# Patient Record
Sex: Female | Born: 1995 | Race: Black or African American | Marital: Single
Health system: Northeastern US, Community
[De-identification: ages and names within clinical notes are randomized; demographics above are authoritative.]

## PROBLEM LIST (undated history)

## (undated) HISTORY — PX: MANDIBLE FRACTURE SURGERY: SHX706

---
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
## 2016-07-14 ENCOUNTER — Emergency Department (HOSPITAL_BASED_OUTPATIENT_CLINIC_OR_DEPARTMENT_OTHER)
Admission: RE | Admit: 2016-07-14 | Disposition: A | Discharge status: Home / Self Care | Payer: Self-pay | Source: Emergency Department | Attending: Emergency Medicine | Admitting: Emergency Medicine

## 2016-07-14 ENCOUNTER — Encounter (HOSPITAL_BASED_OUTPATIENT_CLINIC_OR_DEPARTMENT_OTHER): Payer: Self-pay

## 2016-07-14 LAB — VAGINITIS PANEL (DNA PROBE)
CANDIDA SPECIES: NEGATIVE
GARDNERELLA VAGINALIS: POSITIVE
TRICHOMONAS VAGINALIS: NEGATIVE

## 2016-07-14 LAB — CBC, PLATELET & DIFFERENTIAL
ABSOLUTE BASO COUNT: 0 10*3/uL (ref 0.0–0.1)
ABSOLUTE EOSINOPHIL COUNT: 0.1 10*3/uL (ref 0.0–0.8)
ABSOLUTE IMM GRAN COUNT: 0 10*3/uL (ref 0.00–0.03)
ABSOLUTE LYMPH COUNT: 1.2 10*3/uL (ref 0.6–5.9)
ABSOLUTE MONO COUNT: 0.6 10*3/uL (ref 0.2–1.4)
ABSOLUTE NEUTROPHIL COUNT: 1.3 10*3/uL — ABNORMAL LOW (ref 1.6–8.3)
BASOPHIL %: 1.2 % (ref 0.0–1.2)
EOSINOPHIL %: 1.8 % (ref 0.0–7.0)
HEMATOCRIT: 36.4 % (ref 34.1–44.9)
HEMOGLOBIN: 12.1 g/dL (ref 11.2–15.7)
IMMATURE GRANULOCYTE %: 0 % (ref 0.0–0.4)
LYMPHOCYTE %: 37.8 % (ref 15.0–54.0)
MEAN CORP HGB CONC: 33.2 g/dL (ref 31.0–37.0)
MEAN CORPUSCULAR HGB: 29 pg (ref 26.0–34.0)
MEAN CORPUSCULAR VOL: 87.3 fL (ref 80.0–100.0)
MEAN PLATELET VOLUME: 10.7 fL (ref 8.7–12.5)
MONOCYTE %: 18.9 % — ABNORMAL HIGH (ref 4.0–13.0)
NEUTROPHIL %: 40.3 % (ref 40.0–75.0)
PLATELET COUNT: 266 10*3/uL (ref 150–400)
RBC DISTRIBUTION WIDTH STD DEV: 45.4 fL (ref 35.1–46.3)
RBC DISTRIBUTION WIDTH: 14.4 % — ABNORMAL HIGH (ref 11.5–14.3)
RED BLOOD CELL COUNT: 4.17 M/uL (ref 3.90–5.20)
WHITE BLOOD CELL COUNT: 3.3 10*3/uL — ABNORMAL LOW (ref 4.0–11.0)

## 2016-07-14 LAB — POC URINALYSIS
BILIRUBIN, URINE: NEGATIVE
GLUCOSE,URINE: NEGATIVE
KETONE, URINE: NEGATIVE
LEUKOCYTE ESTERASE: NEGATIVE
NITRITE, URINE: NEGATIVE
PH URINE: 8.5 (ref 5.0–8.0)
PROTEIN, URINE: NEGATIVE
SPECIFIC GRAVITY, URINE: 1.015 (ref 1.003–1.030)
UROBILINOGEN URINE: 0.2 (ref 0.2–1.0)

## 2016-07-14 LAB — HOLD BLOOD BANK SPECIMEN

## 2016-07-14 LAB — URINE PREGNANCY TEST (POINT OF CARE): HCG QUALITATIVE URINE: NEGATIVE

## 2016-07-14 LAB — HOLD RED TOP TUBE

## 2016-07-14 NOTE — ED Triage Note (Signed)
21 year old female here with vaginal bleeding for 5 days, she states she is bleeding a lot, its her regular periods , but one pad per hour for the past 5 days, she went to an urgent care for her numbness in her feet and upper extremities , they prefer her evaluated at ER

## 2016-07-14 NOTE — Narrator Note (Signed)
Patient Disposition    Patient education for diagnosis, medications, activity, diet and follow-up.  Patient left ED 2:40 PM.  Patient rep received written instructions.  Interpreter to provide instructions: Yes    Patient belongings with patient: YES    Have all existing LDAs been addressed? Yes    Have all IV infusions been stopped? Yes    Discharged to: Discharged to home

## 2016-07-14 NOTE — Discharge Instructions (Signed)
Evaluated today for heavy vaginal bleeding.     Your blood counts were normal.     Read all provided instructions. Follow up with your PCP within 1 week. You can use the provided number to follow up with OB/GYN. Seek medical attention if you develop new or worsening symptoms or if you have any other concerns.

## 2016-07-14 NOTE — ED Provider Notes (Signed)
The patient was seen primarily by me. ED nursing record was reviewed. Select prior records as available electronically through the Epic record were reviewed.        HPI:    Kristine Simpson is a 21 year old female patient with h/o depression, ADHD who is p/w heavy vaginal bleeding. Pt states she is currently having her menstrual period. Normally lasts 7 days. First 1-2 days are spotting followed but 2-3 days of normal bleeding and 1-2 days of spotting. States that she is going through 1 pad every 3-4 hours. Has been having progressively heavier menstrual periods. Has a nexplanon in place. Feels fatigued. Also states she has intermittent tingling in her hands and feet. No fevers. No rash. No numbness. No weakness of the extremities.         ROS: Pertinent positives were reviewed as per the HPI above. All other systems were reviewed and are negative.  Kristine Simpson  Language of care: English  MRN: [id number]  PCP: No primary care provider on file.  Mode of arrival to ED: Self.  Arrival time: 07/14/2016 12:16 PM  Chief complaint: Vaginal Bleeding (FEEL FAINT;LOSS OF BLOOD WITH)    Past Medical History/Problem list:  History reviewed. No pertinent past medical history.  There is no problem list on file for this patient.    Past Surgical History: History reviewed. No pertinent surgical history.  Social History:     Social History  Social History   Marital status: Single  Spouse name: N/A    Years of education: N/A  Number of children: N/A     Occupational History  None on file     Social History Main Topics   Smoking status: Not on file    Smokeless tobacco: Not on file    Alcohol use Not on file    Drug use: Unknown     Other Topics Concern   None on file     Social History Narrative   None on file      Allergies: Review of Patient's Allergies indicates:  No Known Allergies  Immunizations:   There is no immunization history on file for this patient.       Medications:  None     Physical Exam:   Patient Vitals for the past  99 hrs:   BP Pulse Resp SpO2   07/14/16 1236 104/68 92 16 100 %       GENERAL:  WDWN, no acute distress, non-toxic   SKIN:  Warm & Dry, no rash, no petechia.  HEAD:  NCAT. Sclerae are anicteric and aninjected, oropharynx is clear with moist mucous membranes. PERRL. EOMI.  NECK:  Supple  LUNGS:  Clear to auscultation bilaterally. No wheezes, rales, rhonchi.   HEART:  RRR.  No murmurs, rubs, or gallops.   ABDOMEN:  Soft, NTND.  No masses.  No involuntary guarding or rebound.   EXTREMITIES:  No obvious deformities.  Warm and well perfused.  No cyanosis, no edema.   GENITOURINARY:  No CVA tenderness B.  Pelvic exam performed with chaperone. Moderate amount of blood within the vaginal vault. No brisk bleeding from the cervix. No CMT or adnexal tenderness.   NEUROLOGIC:  Alert; moves all extremities; speaking in sentences. Normal gait without ataxia. CNsII-XII symmetrical and intact. Sensation intact to light touch throughout. 5/5 strength globally.  PSYCHIATRIC:  Appropriate for age, time of day, and situation    Medications Given in the ED:  Medications - No data to display Radiology Results:  Lab Results (abnormal results only):  Labs Reviewed   CBC + PLT + AUTO DIFF - Abnormal; Notable for the following:        Result Value    WHITE BLOOD CELL COUNT 3.3 (*)     RBC DISTRIBUTION WIDTH 14.4 (*)     MONOCYTE % 18.9 (*)     ABSOLUTE NEUTROPHIL COUNT 1.3 (*)     All other components within normal limits   POC URINALYSIS - Abnormal; Notable for the following:     OCCULT BLOOD, URINE TRACE-LYSED (*)     All other components within normal limits   VAGINITIS PANEL (DNA PROBE) - Abnormal; Notable for the following:     GARDNERELLA VAGINALIS GARDNERELLA VAGINALIS (*)     All other components within normal limits    Narrative:     Source Details->Vag   HOLD RED TOP TUBE   HOLD BLOOD BANK SPECIMEN   AMPLIFIED GENPROBE CHLAM/GC    Narrative:     Source Details->Vaginal   URINE PREGNANCY TEST (POINT OF CARE)    Other Results/Old  Record review (e.g. ECG):       ED Course and Medical Decision-making:  21 year old female patient who is presenting with heavy vaginal bleeding and complaints of fatigue. On exam pt is afebrile, hemodynamically stable.     Pelvic exam without signs of significant vaginal bleeding. H/H stable.     No need for imaging at this time given benign abdominal and pelvic exam.     Discussed OB/GYN f/u. Return precautions discussed.     Patient/family educated on diagnosis(es); she states understanding and agrees with plan of care.  Reasons to return to the ED were reviewed in detail. She agrees with this plan and disposition.    Condition on Discharge: Improved and Stable    Diagnosis/Diagnoses:  Menorrhagia with regular cycle    Ebony Rickel G. Rosalene BillingsSafain, MD  This Emergency Department patient encounter note was created using voice-recognition software and in real time during the ED visit.

## 2016-07-15 LAB — CHLAMYDIA GC NAAT
GENPROBE CHLAMYDIA: NEGATIVE
GENPROBE GC: NEGATIVE

## 2016-07-16 LAB — EKG

## 2016-10-14 ENCOUNTER — Other Ambulatory Visit (HOSPITAL_BASED_OUTPATIENT_CLINIC_OR_DEPARTMENT_OTHER): Payer: Self-pay | Admitting: Psychiatry

## 2016-10-14 NOTE — Progress Notes (Unsigned)
The refill request is not appropriate 10/14/2016

## 2016-11-25 ENCOUNTER — Other Ambulatory Visit (HOSPITAL_BASED_OUTPATIENT_CLINIC_OR_DEPARTMENT_OTHER): Payer: Self-pay | Admitting: Psychiatry

## 2017-02-17 ENCOUNTER — Other Ambulatory Visit (HOSPITAL_BASED_OUTPATIENT_CLINIC_OR_DEPARTMENT_OTHER): Payer: Self-pay | Admitting: Psychiatry

## 2017-05-26 ENCOUNTER — Other Ambulatory Visit (HOSPITAL_BASED_OUTPATIENT_CLINIC_OR_DEPARTMENT_OTHER): Payer: Self-pay | Admitting: Psychiatry

## 2017-05-26 NOTE — Progress Notes (Signed)
Refill refused: Patient should contact provider first. (not a [facility] patient )        Sig: TAKE 1 TABLET BY MOUTH EVERY DAY

## 2018-07-29 ENCOUNTER — Encounter: Payer: Self-pay | Admitting: Nurse Practitioner

## 2018-07-29 ENCOUNTER — Ambulatory Visit: Payer: 59 | Admitting: Nurse Practitioner

## 2018-07-29 ENCOUNTER — Other Ambulatory Visit (HOSPITAL_COMMUNITY)
Admission: RE | Admit: 2018-07-29 | Discharge: 2018-07-29 | Disposition: A | Discharge status: Home / Self Care | Payer: 59 | Source: Ambulatory Visit | Attending: Nurse Practitioner | Admitting: Nurse Practitioner

## 2018-07-29 VITALS — BP 98/70 | HR 102 | Temp 98.6°F | Ht 63.0 in | Wt 128.6 lb

## 2018-07-29 DIAGNOSIS — Z124 Encounter for screening for malignant neoplasm of cervix: Secondary | ICD-10-CM | POA: Insufficient documentation

## 2018-07-29 DIAGNOSIS — Z0000 Encounter for general adult medical examination without abnormal findings: Secondary | ICD-10-CM

## 2018-07-29 LAB — COMPREHENSIVE METABOLIC PANEL
ALK PHOS: 34 U/L — AB (ref 39–117)
ALT: 13 U/L (ref 0–35)
AST: 13 U/L (ref 0–37)
Albumin: 4.5 g/dL (ref 3.5–5.2)
BILIRUBIN TOTAL: 1 mg/dL (ref 0.2–1.2)
BUN: 11 mg/dL (ref 6–23)
CO2: 26 mEq/L (ref 19–32)
CREATININE: 0.84 mg/dL (ref 0.40–1.20)
Calcium: 9 mg/dL (ref 8.4–10.5)
Chloride: 105 mEq/L (ref 96–112)
GFR: 84.83 mL/min (ref >60.00)
GLUCOSE: 86 mg/dL (ref 70–99)
Potassium: 3.9 mEq/L (ref 3.5–5.1)
Sodium: 138 mEq/L (ref 135–145)
TOTAL PROTEIN: 6.7 g/dL (ref 6.0–8.3)

## 2018-07-29 LAB — CBC
HCT: 41.7 % (ref 36.0–46.0)
HEMOGLOBIN: 14 g/dL (ref 12.0–15.0)
MCHC: 33.4 g/dL (ref 30.0–36.0)
MCV: 88.9 fl (ref 78.0–100.0)
PLATELETS: 278 10*3/uL (ref 150.0–400.0)
RBC: 4.7 Mil/uL (ref 3.87–5.11)
RDW: 12.5 % (ref 11.5–15.5)
WBC: 7.7 10*3/uL (ref 4.0–10.5)

## 2018-07-29 LAB — TSH: TSH: 1.67 u[IU]/mL (ref 0.35–4.50)

## 2018-07-29 NOTE — Progress Notes (Signed)
Subjective:    Patient ID: Jennifer Haley, female    DOB: 1996-06-02, 23 y.o.   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 Work: Educational psychologist  Sexual History (orientation,birth control, marital status, STD):married, sexually active, use of condoms, 1 female partner. No STD in past. LMP 07/03/2018, regular.  Depression/Suicide: Depression screen [facility] 2/9 07/29/2018  Decreased Interest 0  Down, Depressed, Hopeless 0  PHQ - 2 Score 0   Vision:done annually, use or contact lens  Dental:done every 81months  Immunizations: (TDAP, Hep C screen, Pneumovax, Influenza, zoster)  Health Maintenance  Topic Date Due  . Tetanus Vaccine  08/21/2015  . PAP-Cervical Cytology Screening  08/20/2017  . Pap Smear  08/20/2017  . HIV Screening  07/30/2019*  . Flu Shot  Completed  *Topic was postponed. The date shown is not the original due date.   Diet:regular.  Weight:  Wt Readings from Last 3 Encounters:  07/29/18 128 lb 9.6 oz (58.3 kg)   Exercise:2-3times a week, weight training and cardio  Fall Risk: Fall Risk  07/29/2018  Falls in the past year? 0   Home Safety: home with husband  Medications and allergies reviewed with patient and updated if appropriate.  There are no active problems to display for this patient.   Current Outpatient Medications on File Prior to Visit  Medication Sig Dispense Refill  . cephALEXin (KEFLEX) 500 MG capsule Take 1 capsule (500 mg total) by mouth 2 (two) times daily. (Patient not taking: Reported on 07/29/2018) 14 capsule 0  . dicyclomine (BENTYL) 20 MG tablet Take 1 tablet (20 mg total) by mouth once. (Patient not taking: Reported on 07/29/2018) 20 tablet 0  . ibuprofen (ADVIL,MOTRIN) 200 MG tablet Take 400 mg by mouth every 6 (six) hours as needed for headache, mild pain or moderate pain.    Marland Kitchen loperamide (IMODIUM) 2 MG capsule Take 1 capsule (2 mg total) by mouth 4  (four) times daily as needed for diarrhea or loose stools. (Patient not taking: Reported on 07/29/2018) 12 capsule 0  . ondansetron (ZOFRAN) 4 MG tablet Take 1 tablet (4 mg total) by mouth every 8 (eight) hours as needed for nausea or vomiting. (Patient not taking: Reported on 07/29/2018) 10 tablet 0   No current facility-administered medications on file prior to visit.     History reviewed. No pertinent past medical history.  Past Surgical History:  Procedure Laterality Date  . MANDIBLE FRACTURE SURGERY      Social History   Socioeconomic History  . Marital status: Married    Spouse name: Not on file  . Number of children: 0  . Years of education: Not on file  . Highest education level: Not on file  Occupational History    Comment: student: Engineer, drilling    Comment: Works at [employer]  Social Needs  . Financial resource strain: Not hard at all  . Food insecurity:    Worry: Patient refused    Inability: Patient refused  . Transportation needs:    Medical: Patient refused    Non-medical: Patient refused  Tobacco Use  . Smoking status: Never Smoker  . Smokeless tobacco: Never Used  Substance and Sexual Activity  . Alcohol use: Yes    Comment: social  . Drug use: No  . Sexual activity: Yes    Birth control/protection: Condom  Lifestyle  . Physical activity:    Days per week:  Not on file    Minutes per session: Not on file  . Stress: Not on file  Relationships  . Social connections:    Talks on phone: More than three times a week    Gets together: More than three times a week    Attends religious service: Never    Active member of club or organization: No    Attends meetings of clubs or organizations: Never    Relationship status: Married  Other Topics Concern  . Not on file  Social History Narrative  . Not on file    Family History  Problem Relation Age of Onset  . Asthma Brother   . Heart disease Paternal Grandfather         Review  of Systems  Constitutional: Negative for fever, malaise/fatigue and weight loss.  HENT: Negative for congestion and sore throat.   Eyes:       Negative for visual changes  Respiratory: Negative for cough and shortness of breath.   Cardiovascular: Negative for chest pain, palpitations and leg swelling.  Gastrointestinal: Negative for blood in stool, constipation, diarrhea and heartburn.  Genitourinary: Negative for dysuria, frequency and urgency.  Musculoskeletal: Negative for falls, joint pain and myalgias.  Skin: Negative for rash.  Neurological: Negative for dizziness, sensory change and headaches.  Endo/Heme/Allergies: Does not bruise/bleed easily.  Psychiatric/Behavioral: Negative for depression, substance abuse and suicidal ideas. The patient is not nervous/anxious.     Objective:   Vitals:   07/29/18 1309  BP: 98/70  Pulse: (!) 102  Temp: 98.6 F (37 C)  SpO2: 98%    Body mass index is 22.78 kg/m.   Physical Examination:  Physical Exam Vitals signs reviewed. Exam conducted with a chaperone present.  Constitutional:      General: She is not in acute distress.    Appearance: She is well-developed.  HENT:     Right Ear: Tympanic membrane, ear canal and external ear normal.     Left Ear: Tympanic membrane, ear canal and external ear normal.     Nose: Nose normal.     Mouth/Throat:     Pharynx: No oropharyngeal exudate.  Eyes:     Conjunctiva/sclera: Conjunctivae normal.     Pupils: Pupils are equal, round, and reactive to light.  Neck:     Musculoskeletal: Normal range of motion and neck supple.     Thyroid: No thyroid mass, thyromegaly or thyroid tenderness.  Cardiovascular:     Rate and Rhythm: Normal rate and regular rhythm.     Heart sounds: Normal heart sounds.  Pulmonary:     Effort: Pulmonary effort is normal. No respiratory distress.     Breath sounds: Normal breath sounds.  Chest:     Chest wall: No tenderness.     Breasts:        Right: Normal.          Left: Normal.  Abdominal:     General: Abdomen is flat. Bowel sounds are normal.     Palpations: Abdomen is soft.  Genitourinary:    Vagina: Vaginal discharge present. No erythema or bleeding.     Cervix: No cervical motion tenderness, discharge or friability.     Uterus: Normal.      Adnexa: Right adnexa normal and left adnexa normal.  Musculoskeletal: Normal range of motion.  Lymphadenopathy:     Cervical: No cervical adenopathy.     Upper Body:     Right upper body: No supraclavicular, axillary or pectoral adenopathy.  Left upper body: No supraclavicular, axillary or pectoral adenopathy.  Skin:    Findings: No erythema or rash.  Neurological:     Mental Status: She is alert and oriented to person, place, and time.     Deep Tendon Reflexes: Reflexes are normal and symmetric.  Psychiatric:        Mood and Affect: Mood normal.        Behavior: Behavior normal.        Thought Content: Thought content normal.     ASSESSMENT and PLAN:  Mardella LaymanLindsey was seen today for establish care.  Diagnoses and all orders for this visit:  Preventative health care -     CBC -     Comprehensive metabolic panel -     TSH -     Cytology - PAP( [place])  Encounter for Papanicolaou smear for cervical cancer screening -     Cytology - PAP( [place])   No problem-specific Assessment & Plan notes found for this encounter.     Problem List Items Addressed This Visit    None    Visit Diagnoses    Preventative health care    -  Primary   Relevant Orders   CBC   Comprehensive metabolic panel   TSH   Cytology - PAP( [place])   Encounter for Papanicolaou smear for cervical cancer screening       Relevant Orders   Cytology - PAP( [place])       Follow up: Return if symptoms worsen or fail to improve.  Alysia Pennaharlotte Delcenia Inman, NP

## 2018-07-29 NOTE — Patient Instructions (Addendum)
Ok to use florastor or curturelle probiotics 1cap once a day.  Go to lab for blood draw.  You will be contacted with results.   Health Maintenance, Female Adopting a healthy lifestyle and getting preventive care can go a long way to promote health and wellness. Talk with your health care provider about what schedule of regular examinations is right for you. This is a good chance for you to check in with your provider about disease prevention and staying healthy. In between checkups, there are plenty of things you can do on your own. Experts have done a lot of research about which lifestyle changes and preventive measures are most likely to keep you healthy. Ask your health care provider for more information. Weight and diet Eat a healthy diet  Be sure to include plenty of vegetables, fruits, low-fat dairy products, and lean protein.  Do not eat a lot of foods high in solid fats, added sugars, or salt.  Get regular exercise. This is one of the most important things you can do for your health. ? Most adults should exercise for at least 150 minutes each week. The exercise should increase your heart rate and make you sweat (moderate-intensity exercise). ? Most adults should also do strengthening exercises at least twice a week. This is in addition to the moderate-intensity exercise. Maintain a healthy weight  Body mass index (BMI) is a measurement that can be used to identify possible weight problems. It estimates body fat based on height and weight. Your health care provider can help determine your BMI and help you achieve or maintain a healthy weight.  For females 46 years of age and older: ? A BMI below 18.5 is considered underweight. ? A BMI of 18.5 to 24.9 is normal. ? A BMI of 25 to 29.9 is considered overweight. ? A BMI of 30 and above is considered obese. Watch levels of cholesterol and blood lipids  You should start having your blood tested for lipids and cholesterol at 22 years of  age, then have this test every 5 years.  You may need to have your cholesterol levels checked more often if: ? Your lipid or cholesterol levels are high. ? You are older than 22 years of age. ? You are at high risk for heart disease. Cancer screening Lung Cancer  Lung cancer screening is recommended for adults 3-72 years old who are at high risk for lung cancer because of a history of smoking.  A yearly low-dose CT scan of the lungs is recommended for people who: ? Currently smoke. ? Have quit within the past 15 years. ? Have at least a 30-pack-year history of smoking. A pack year is smoking an average of one pack of cigarettes a day for 1 year.  Yearly screening should continue until it has been 15 years since you quit.  Yearly screening should stop if you develop a health problem that would prevent you from having lung cancer treatment. Breast Cancer  Practice breast self-awareness. This means understanding how your breasts normally appear and feel.  It also means doing regular breast self-exams. Let your health care provider know about any changes, no matter how small.  If you are in your 20s or 30s, you should have a clinical breast exam (CBE) by a health care provider every 1-3 years as part of a regular health exam.  If you are 58 or older, have a CBE every year. Also consider having a breast X-ray (mammogram) every year.  If you  have a family history of breast cancer, talk to your health care provider about genetic screening.  If you are at high risk for breast cancer, talk to your health care provider about having an MRI and a mammogram every year.  Breast cancer gene (BRCA) assessment is recommended for women who have family members with BRCA-related cancers. BRCA-related cancers include: ? Breast. ? Ovarian. ? Tubal. ? Peritoneal cancers.  Results of the assessment will determine the need for genetic counseling and BRCA1 and BRCA2 testing. Cervical Cancer Your  health care provider may recommend that you be screened regularly for cancer of the pelvic organs (ovaries, uterus, and vagina). This screening involves a pelvic examination, including checking for microscopic changes to the surface of your cervix (Pap test). You may be encouraged to have this screening done every 3 years, beginning at age 39.  For women ages 59-65, health care providers may recommend pelvic exams and Pap testing every 3 years, or they may recommend the Pap and pelvic exam, combined with testing for human papilloma virus (HPV), every 5 years. Some types of HPV increase your risk of cervical cancer. Testing for HPV may also be done on women of any age with unclear Pap test results.  Other health care providers may not recommend any screening for nonpregnant women who are considered low risk for pelvic cancer and who do not have symptoms. Ask your health care provider if a screening pelvic exam is right for you.  If you have had past treatment for cervical cancer or a condition that could lead to cancer, you need Pap tests and screening for cancer for at least 20 years after your treatment. If Pap tests have been discontinued, your risk factors (such as having a new sexual partner) need to be reassessed to determine if screening should resume. Some women have medical problems that increase the chance of getting cervical cancer. In these cases, your health care provider may recommend more frequent screening and Pap tests. Colorectal Cancer  This type of cancer can be detected and often prevented.  Routine colorectal cancer screening usually begins at 22 years of age and continues through 22 years of age.  Your health care provider may recommend screening at an earlier age if you have risk factors for colon cancer.  Your health care provider may also recommend using home test kits to check for hidden blood in the stool.  A small camera at the end of a tube can be used to examine your  colon directly (sigmoidoscopy or colonoscopy). This is done to check for the earliest forms of colorectal cancer.  Routine screening usually begins at age 25.  Direct examination of the colon should be repeated every 5-10 years through 22 years of age. However, you may need to be screened more often if early forms of precancerous polyps or small growths are found. Skin Cancer  Check your skin from head to toe regularly.  Tell your health care provider about any new moles or changes in moles, especially if there is a change in a mole's shape or color.  Also tell your health care provider if you have a mole that is larger than the size of a pencil eraser.  Always use sunscreen. Apply sunscreen liberally and repeatedly throughout the day.  Protect yourself by wearing long sleeves, pants, a wide-brimmed hat, and sunglasses whenever you are outside. Heart disease, diabetes, and high blood pressure  High blood pressure causes heart disease and increases the risk of stroke. High  blood pressure is more likely to develop in: ? People who have blood pressure in the high end of the normal range (130-139/85-89 mm Hg). ? People who are overweight or obese. ? People who are African American.  If you are 45-18 years of age, have your blood pressure checked every 3-5 years. If you are 69 years of age or older, have your blood pressure checked every year. You should have your blood pressure measured twice-once when you are at a hospital or clinic, and once when you are not at a hospital or clinic. Record the average of the two measurements. To check your blood pressure when you are not at a hospital or clinic, you can use: ? An automated blood pressure machine at a pharmacy. ? A home blood pressure monitor.  If you are between 32 years and 44 years old, ask your health care provider if you should take aspirin to prevent strokes.  Have regular diabetes screenings. This involves taking a blood sample to  check your fasting blood sugar level. ? If you are at a normal weight and have a low risk for diabetes, have this test once every three years after 21 years of age. ? If you are overweight and have a high risk for diabetes, consider being tested at a younger age or more often. Preventing infection Hepatitis B  If you have a higher risk for hepatitis B, you should be screened for this virus. You are considered at high risk for hepatitis B if: ? You were born in a country where hepatitis B is common. Ask your health care provider which countries are considered high risk. ? Your parents were born in a high-risk country, and you have not been immunized against hepatitis B (hepatitis B vaccine). ? You have HIV or AIDS. ? You use needles to inject street drugs. ? You live with someone who has hepatitis B. ? You have had sex with someone who has hepatitis B. ? You get hemodialysis treatment. ? You take certain medicines for conditions, including cancer, organ transplantation, and autoimmune conditions. Hepatitis C  Blood testing is recommended for: ? Everyone born from [birth year] through 1964. ? Anyone with known risk factors for hepatitis C. Sexually transmitted infections (STIs)  You should be screened for sexually transmitted infections (STIs) including gonorrhea and chlamydia if: ? You are sexually active and are younger than 22 years of age. ? You are older than 22 years of age and your health care provider tells you that you are at risk for this type of infection. ? Your sexual activity has changed since you were last screened and you are at an increased risk for chlamydia or gonorrhea. Ask your health care provider if you are at risk.  If you do not have HIV, but are at risk, it may be recommended that you take a prescription medicine daily to prevent HIV infection. This is called pre-exposure prophylaxis (PrEP). You are considered at risk if: ? You are sexually active and do not regularly use  condoms or know the HIV status of your partner(s). ? You take drugs by injection. ? You are sexually active with a partner who has HIV. Talk with your health care provider about whether you are at high risk of being infected with HIV. If you choose to begin PrEP, you should first be tested for HIV. You should then be tested every 3 months for as long as you are taking PrEP. Pregnancy  If you are premenopausal and you  may become pregnant, ask your health care provider about preconception counseling.  If you may become pregnant, take 400 to 800 micrograms (mcg) of folic acid every day.  If you want to prevent pregnancy, talk to your health care provider about birth control (contraception). Osteoporosis and menopause  Osteoporosis is a disease in which the bones lose minerals and strength with aging. This can result in serious bone fractures. Your risk for osteoporosis can be identified using a bone density scan.  If you are 55 years of age or older, or if you are at risk for osteoporosis and fractures, ask your health care provider if you should be screened.  Ask your health care provider whether you should take a calcium or vitamin D supplement to lower your risk for osteoporosis.  Menopause may have certain physical symptoms and risks.  Hormone replacement therapy may reduce some of these symptoms and risks. Talk to your health care provider about whether hormone replacement therapy is right for you. Follow these instructions at home:  Schedule regular health, dental, and eye exams.  Stay current with your immunizations.  Do not use any tobacco products including cigarettes, chewing tobacco, or electronic cigarettes.  If you are pregnant, do not drink alcohol.  If you are breastfeeding, limit how much and how often you drink alcohol.  Limit alcohol intake to no more than 1 drink per day for nonpregnant women. One drink equals 12 ounces of beer, 5 ounces of wine, or 1 ounces of  hard liquor.  Do not use street drugs.  Do not share needles.  Ask your health care provider for help if you need support or information about quitting drugs.  Tell your health care provider if you often feel depressed.  Tell your health care provider if you have ever been abused or do not feel safe at home. This information is not intended to replace advice given to you by your health care provider. Make sure you discuss any questions you have with your health care provider. Document Released: 12/17/2010 Document Revised: 11/09/2015 Document Reviewed: 03/07/2015 Elsevier Interactive Patient Education  2019 [employer].

## 2018-07-30 LAB — CYTOLOGY - PAP
BACTERIAL VAGINITIS: NEGATIVE
Candida vaginitis: NEGATIVE
Diagnosis: NEGATIVE

## 2018-07-31 ENCOUNTER — Encounter: Payer: Self-pay | Admitting: Nurse Practitioner

## 2018-07-31 ENCOUNTER — Telehealth: Payer: Self-pay | Admitting: Nurse Practitioner

## 2018-07-31 NOTE — Telephone Encounter (Signed)
Copied from CRM #[id number]. Topic: Quick Communication - See Telephone Encounter °>> Jul 31, 2018  3:27 PM Williams, Candice N wrote: °CRM for notification. See Telephone encounter for: 07/31/18. ° °Patient calling to check status of lab results from 2/12. Please advise. °

## 2018-07-31 NOTE — Telephone Encounter (Signed)
Pt is returning call for results 

## 2018-07-31 NOTE — Telephone Encounter (Unsigned)
Copied from CRM [id number]. Topic: Quick Communication - See Telephone Encounter >> Jul 31, 2018  3:27 PM Angela Nevin wrote: CRM for notification. See Telephone encounter for: 07/31/18.  Patient calling to check status of lab results from 2/12. Please advise.

## 2018-07-31 NOTE — Telephone Encounter (Signed)
Pt calling for advise regarding yeast like "Itching."  Message to C. Nche routed in lab results. Instructed pt to speak with pharmacist for recommendations.

## 2018-07-31 NOTE — Telephone Encounter (Signed)
Just left pt VM to return call about lab results. 07/29/2018

## 2018-07-31 NOTE — Telephone Encounter (Signed)
Addressed in result note.  

## 2018-08-03 NOTE — Telephone Encounter (Signed)
This encounter was created in error - please disregard.

## 2018-09-21 DIAGNOSIS — H0015 Chalazion left lower eyelid: Secondary | ICD-10-CM | POA: Diagnosis not present

## 2018-10-05 ENCOUNTER — Telehealth: Payer: Self-pay | Admitting: Nurse Practitioner

## 2018-10-05 NOTE — Telephone Encounter (Signed)
I called and left message on patient voicemail per Alysia Penna request to call office and schedule appointment for physical.

## 2018-10-13 ENCOUNTER — Telehealth: Payer: Self-pay | Admitting: Nurse Practitioner

## 2018-10-13 NOTE — Telephone Encounter (Signed)
Copied from CRM [id number]. Topic: Quick Communication - See Telephone Encounter >> Oct 13, 2018  4:23 PM Aretta Nip wrote: CRM for notification. See Telephone encounter for: 10/13/18. PT called after hrs Wants a sch appt with Claris Gower for CPE in July. Best phone is [phone]

## 2018-10-14 NOTE — Telephone Encounter (Signed)
I called and spoke to patient. Patient scheduled for CPE on 01/12/2019 @ 8:15am.

## 2018-10-14 NOTE — Telephone Encounter (Signed)
Please help the pt with the appt.

## 2018-12-10 DIAGNOSIS — H5213 Myopia, bilateral: Secondary | ICD-10-CM | POA: Diagnosis not present

## 2018-12-21 ENCOUNTER — Telehealth: Payer: Self-pay | Admitting: Nurse Practitioner

## 2018-12-21 NOTE — Telephone Encounter (Signed)
Spoke with the pt, she will wait to see Baldo Ash on Friday because she just got out and still taking med right now. She is feeling better,no other complaint. Advise the pt that tramadol is PRN for the pain--if pt do not have any pain then she doesn't have to take it. Advised the pt to call back if she wants to see [place] sooner. Pt agree.   Copied from [place] [id number]. Topic: General - Other >> Dec 21, 2018  9:34 AM Marin Olp L wrote: Reason for CRM: Patient would like to know her blood type and wants this added on if you all don't know it already to her 01/12/2019 CPE appt?

## 2019-01-11 ENCOUNTER — Telehealth: Payer: Self-pay | Admitting: Nurse Practitioner

## 2019-01-11 NOTE — Telephone Encounter (Signed)

## 2019-01-12 ENCOUNTER — Other Ambulatory Visit: Payer: Self-pay

## 2019-01-12 ENCOUNTER — Ambulatory Visit (INDEPENDENT_AMBULATORY_CARE_PROVIDER_SITE_OTHER): Payer: 59 | Admitting: Nurse Practitioner

## 2019-01-12 ENCOUNTER — Encounter: Payer: Self-pay | Admitting: Nurse Practitioner

## 2019-01-12 VITALS — BP 110/66 | HR 81 | Temp 98.5°F | Ht 63.78 in | Wt 127.8 lb

## 2019-01-12 DIAGNOSIS — R109 Unspecified abdominal pain: Secondary | ICD-10-CM | POA: Diagnosis not present

## 2019-01-12 DIAGNOSIS — Z23 Encounter for immunization: Secondary | ICD-10-CM | POA: Diagnosis not present

## 2019-01-12 NOTE — Patient Instructions (Addendum)
Use tylenol or ibuprofen if pain is severe and/or prologned. I think intermittent discomfort is due to gas pain. Return to office if becomes more frequent or worsens or associated with any new symptoms.  Abdominal Pain, Adult  Many things can cause belly (abdominal) pain. Most times, belly pain is not dangerous. Many cases of belly pain can be watched and treated at home. Sometimes belly pain is serious, though. Your doctor will try to find the cause of your belly pain. Follow these instructions at home:  Take over-the-counter and prescription medicines only as told by your doctor. Do not take medicines that help you poop (laxatives) unless told to by your doctor.  Drink enough fluid to keep your pee (urine) clear or pale yellow.  Watch your belly pain for any changes.  Keep all follow-up visits as told by your doctor. This is important. Contact a doctor if:  Your belly pain changes or gets worse.  You are not hungry, or you lose weight without trying.  You are having trouble pooping (constipated) or have watery poop (diarrhea) for more than 2-3 days.  You have pain when you pee or poop.  Your belly pain wakes you up at night.  Your pain gets worse with meals, after eating, or with certain foods.  You are throwing up and cannot keep anything down.  You have a fever. Get help right away if:  Your pain does not go away as soon as your doctor says it should.  You cannot stop throwing up.  Your pain is only in areas of your belly, such as the right side or the left lower part of the belly.  You have bloody or black poop, or poop that looks like tar.  You have very bad pain, cramping, or bloating in your belly.  You have signs of not having enough fluid or water in your body (dehydration), such as: ? Dark pee, very little pee, or no pee. ? Cracked lips. ? Dry mouth. ? Sunken eyes. ? Sleepiness. ? Weakness. This information is not intended to replace advice given to you  by your health care provider. Make sure you discuss any questions you have with your health care provider. Document Released: 11/20/2007 Document Revised: 12/22/2015 Document Reviewed: 11/15/2015 Elsevier Interactive Patient Education  [employer].

## 2019-01-12 NOTE — Progress Notes (Signed)
Subjective:  Patient ID: Jennifer Haley, female    DOB: 08/31/1995  Age: 23 y.o. MRN: [id number]  CC: Acute Visit (right side pain)  Abdominal Pain This is a chronic problem. The current episode started more than 1 month ago. The onset quality is gradual. The problem occurs intermittently. Duration: 1-2days, each episodes last for a few seconds. The problem has been unchanged. The pain is located in the RLQ. The quality of the pain is colicky and aching. The abdominal pain does not radiate. Pertinent negatives include no anorexia, arthralgias, belching, constipation, diarrhea, dysuria, fever, frequency, headaches, hematochezia, hematuria, melena, myalgias, nausea, vomiting or weight loss. Nothing aggravates the pain. The pain is relieved by nothing. She has tried nothing for the symptoms. There is no history of gallstones, GERD, irritable bowel syndrome, PUD or ulcerative colitis.   Reviewed past Medical, Social and Family history today.  Outpatient Medications Prior to Visit  Medication Sig Dispense Refill  . OVER THE COUNTER MEDICATION Pamprin--menstrual pain relief    . ibuprofen (ADVIL,MOTRIN) 200 MG tablet Take 400 mg by mouth every 6 (six) hours as needed for headache, mild pain or moderate pain.    . cephALEXin (KEFLEX) 500 MG capsule Take 1 capsule (500 mg total) by mouth 2 (two) times daily. (Patient not taking: Reported on 07/29/2018) 14 capsule 0  . dicyclomine (BENTYL) 20 MG tablet Take 1 tablet (20 mg total) by mouth once. (Patient not taking: Reported on 07/29/2018) 20 tablet 0  . loperamide (IMODIUM) 2 MG capsule Take 1 capsule (2 mg total) by mouth 4 (four) times daily as needed for diarrhea or loose stools. (Patient not taking: Reported on 07/29/2018) 12 capsule 0  . ondansetron (ZOFRAN) 4 MG tablet Take 1 tablet (4 mg total) by mouth every 8 (eight) hours as needed for nausea or vomiting. (Patient not taking: Reported on 07/29/2018) 10 tablet 0   No facility-administered  medications prior to visit.     ROS See HPI  Objective:  BP 110/66   Pulse 81   Temp 98.5 F (36.9 C) (Oral)   Ht 5' 3.78" (1.62 m)   Wt 127 lb 12.8 oz (58 kg)   LMP 01/10/2019   SpO2 99%   BMI 22.09 kg/m   BP Readings from Last 3 Encounters:  01/12/19 110/66  07/29/18 98/70  03/15/15 (!) 101/50    Wt Readings from Last 3 Encounters:  01/12/19 127 lb 12.8 oz (58 kg)  07/29/18 128 lb 9.6 oz (58.3 kg)   Physical Exam Vitals signs reviewed.  Cardiovascular:     Pulses: Normal pulses.  Pulmonary:     Effort: Pulmonary effort is normal.  Abdominal:     General: Abdomen is flat. Bowel sounds are normal.     Palpations: Abdomen is soft.     Tenderness: There is no abdominal tenderness. There is no right CVA tenderness, left CVA tenderness or guarding.  Neurological:     Mental Status: She is alert.     Lab Results  Component Value Date   WBC 7.7 07/29/2018   HGB 14.0 07/29/2018   HCT 41.7 07/29/2018   PLT 278.0 07/29/2018   GLUCOSE 86 07/29/2018   ALT 13 07/29/2018   AST 13 07/29/2018   NA 138 07/29/2018   K 3.9 07/29/2018   CL 105 07/29/2018   CREATININE 0.84 07/29/2018   BUN 11 07/29/2018   CO2 26 07/29/2018   TSH 1.67 07/29/2018    Assessment & Plan:   Mardella LaymanLindsey was seen today  for acute visit.  Diagnoses and all orders for this visit:  Right sided abdominal pain  Need for diphtheria-tetanus-pertussis (Tdap) vaccine -     Tdap vaccine greater than or equal to 7yo IM   I have discontinued Mendel Ryder Haley's ondansetron, loperamide, dicyclomine, and cephALEXin. I am also having her maintain her ibuprofen and OVER THE COUNTER MEDICATION.  No orders of the defined types were placed in this encounter.   Problem List Items Addressed This Visit    None    Visit Diagnoses    Right sided abdominal pain    -  Primary   Need for diphtheria-tetanus-pertussis (Tdap) vaccine       Relevant Orders   Tdap vaccine greater than or equal to 7yo IM (Completed)        Follow-up: Return in about 7 months (around 07/30/2019) for CPE(FASTING).  Wilfred Lacy, NP

## 2019-01-13 ENCOUNTER — Encounter: Payer: Self-pay | Admitting: Nurse Practitioner

## 2019-04-22 ENCOUNTER — Other Ambulatory Visit: Payer: Self-pay

## 2019-04-22 DIAGNOSIS — Z20822 Contact with and (suspected) exposure to covid-19: Secondary | ICD-10-CM

## 2019-04-24 LAB — NOVEL CORONAVIRUS, NAA: SARS-CoV-2, NAA: NOT DETECTED

## 2019-06-18 DIAGNOSIS — F419 Anxiety disorder, unspecified: Secondary | ICD-10-CM

## 2019-06-18 HISTORY — DX: Anxiety disorder, unspecified: F41.9

## 2019-08-17 ENCOUNTER — Encounter: Payer: 59 | Admitting: Nurse Practitioner

## 2019-10-29 ENCOUNTER — Telehealth: Payer: Self-pay | Admitting: Nurse Practitioner

## 2019-10-29 NOTE — Telephone Encounter (Signed)
Patient would like a transfer of care from [name] to [place] location patient would like to start seeing [name]

## 2019-11-01 NOTE — Telephone Encounter (Signed)
OK with me.

## 2019-11-01 NOTE — Telephone Encounter (Signed)
ok 

## 2019-12-15 DIAGNOSIS — H5213 Myopia, bilateral: Secondary | ICD-10-CM | POA: Diagnosis not present

## 2019-12-22 ENCOUNTER — Encounter: Payer: 59 | Admitting: Family

## 2020-01-06 ENCOUNTER — Ambulatory Visit (INDEPENDENT_AMBULATORY_CARE_PROVIDER_SITE_OTHER): Payer: 59 | Admitting: Family Medicine

## 2020-01-06 ENCOUNTER — Other Ambulatory Visit: Payer: Self-pay

## 2020-01-06 ENCOUNTER — Encounter: Payer: Self-pay | Admitting: Family Medicine

## 2020-01-06 VITALS — BP 103/67 | HR 76 | Temp 99.2°F | Resp 16 | Ht 64.0 in | Wt 132.2 lb

## 2020-01-06 DIAGNOSIS — Z6791 Unspecified blood type, Rh negative: Secondary | ICD-10-CM

## 2020-01-06 DIAGNOSIS — L720 Epidermal cyst: Secondary | ICD-10-CM

## 2020-01-06 DIAGNOSIS — I7300 Raynaud's syndrome without gangrene: Secondary | ICD-10-CM

## 2020-01-06 DIAGNOSIS — Z566 Other physical and mental strain related to work: Secondary | ICD-10-CM

## 2020-01-06 DIAGNOSIS — Z3009 Encounter for other general counseling and advice on contraception: Secondary | ICD-10-CM | POA: Diagnosis not present

## 2020-01-06 NOTE — Progress Notes (Signed)
Subjective:    Patient ID: Jennifer Haley, female    DOB: 04-17-1996, 24 y.o.   MRN: [id number]  HPI New to establish.  Previous PCP- [facility]  Cold fingers/toes- 'for as long as I remember'.  Occurs regardless of season.  Will have burning and tingling when extremities warm up.  No color change.  No hx of sores  Cyst- L sided bikini line, present for a few years.  Does not typically hurt.  Will become sore around menses.    Pt doesn't know blood type and would like to know as a baseline for family planning purposes  Job stress- pt started a new job 2 months ago.  Long commute.  Doesn't like what she is doing.  A lot of stress.  Currently applying for new job.  Working on mindfulness, exercise.  Not currently interested in medication.  Review of Systems For ROS see HPI   This visit occurred during the SARS-CoV-2 public health emergency.  Safety protocols were in place, including screening questions prior to the visit, additional usage of staff PPE, and extensive cleaning of exam room while observing appropriate contact time as indicated for disinfecting solutions.       Objective:   Physical Exam Vitals reviewed.  Constitutional:      General: She is not in acute distress.    Appearance: Normal appearance. She is not ill-appearing.  HENT:     Head: Normocephalic and atraumatic.  Eyes:     Extraocular Movements: Extraocular movements intact.     Conjunctiva/sclera: Conjunctivae normal.     Pupils: Pupils are equal, round, and reactive to light.  Cardiovascular:     Rate and Rhythm: Normal rate and regular rhythm.     Pulses: Normal pulses.     Heart sounds: Normal heart sounds.  Pulmonary:     Effort: Pulmonary effort is normal. No respiratory distress.     Breath sounds: Normal breath sounds. No wheezing or rhonchi.  Genitourinary:    Comments: Small, freely mobile subcutaneous cyst in L bikini line Musculoskeletal:     Cervical back: Normal range of motion and neck supple.    Lymphadenopathy:     Cervical: No cervical adenopathy.  Neurological:     General: No focal deficit present.     Mental Status: She is alert and oriented to person, place, and time.     Cranial Nerves: No cranial nerve deficit.     Motor: No weakness.     Gait: Gait normal.  Psychiatric:     Comments: anxious           Assessment & Plan:  Raynauds- new to provider, ongoing for pt.  Thankfully sxs are mild and no ulceration present.  Discussed importance of dressing in layers and keeping extremities warm to reduce sxs.  Pt expressed understanding and is in agreement w/ plan.   Cyst- new.  L bikini line.  No evidence of infxn.  Discussed possible excision.  At this time, pt is more interested in watchful waiting.  Family planning/unknown blood type- pt would like to know her blood type as she and husband consider children in the future  Stressful job- new.  Pt started new job 2 months ago that has a long commute, she doesn't enjoy what she's doing, and overall the entire situation is stressful.  She is trying to work on stress management and mindfulness but knows this is taking its toll.  Discussed medication but pt is not interested at this time.  Will follow.

## 2020-01-06 NOTE — Patient Instructions (Signed)
Schedule your complete physical in 6 months We'll notify you of your blood type The cold fingers and toes are mild Raynaud's- LAYER! Thankfully the cyst is small and behaving itself.  If this enlarges or becomes painful, let me know If the job stress becomes more than you can manage, let me know! Call with any questions or concerns Welcome!  We're glad to have you!

## 2020-01-07 LAB — ABO AND RH

## 2020-02-07 ENCOUNTER — Encounter: Payer: Self-pay | Admitting: Family Medicine

## 2020-02-07 MED ORDER — ESCITALOPRAM OXALATE 10 MG PO TABS: 10.0000 mg | ORAL_TABLET | Freq: Every day | ORAL | 3 refills | Status: DC

## 2020-02-22 DIAGNOSIS — F4322 Adjustment disorder with anxiety: Secondary | ICD-10-CM | POA: Diagnosis not present

## 2020-03-07 DIAGNOSIS — F4322 Adjustment disorder with anxiety: Secondary | ICD-10-CM | POA: Diagnosis not present

## 2020-03-09 ENCOUNTER — Ambulatory Visit: Payer: 59 | Admitting: Family Medicine

## 2020-03-09 ENCOUNTER — Other Ambulatory Visit: Payer: Self-pay

## 2020-03-09 ENCOUNTER — Encounter: Payer: Self-pay | Admitting: Family Medicine

## 2020-03-09 VITALS — BP 121/72 | HR 76 | Temp 99.9°F | Resp 16 | Ht 64.0 in | Wt 132.1 lb

## 2020-03-09 DIAGNOSIS — Z23 Encounter for immunization: Secondary | ICD-10-CM | POA: Diagnosis not present

## 2020-03-09 DIAGNOSIS — F419 Anxiety disorder, unspecified: Secondary | ICD-10-CM | POA: Diagnosis not present

## 2020-03-09 NOTE — Patient Instructions (Signed)
Follow up as needed or as scheduled Continue the Lexapro daily I'm SO proud of you for starting therapy!  That's HUGE! Call with any questions of concerns Hang in there!!

## 2020-03-09 NOTE — Addendum Note (Signed)
Addended by: Lana Fish on: 03/09/2020 09:21 AM   Modules accepted: Orders

## 2020-03-09 NOTE — Assessment & Plan Note (Signed)
New.  Pt had mentioned it at her first visit but sxs acutely worsened this summer.  At the time she felt like she was unable to take a full/deep breath.  We decided to start Lexapro via MyChart.  So far she reports things are going well and she is feeling better.  Breathing issue has resolved.  Has also decided to start therapy to work through some of the underlying issues.  Applauded this decision.  No med changes at this time but will follow closely.

## 2020-03-09 NOTE — Progress Notes (Signed)
   Subjective:    Patient ID: Jennifer Haley, female    DOB: 1995/10/17, 24 y.o.   MRN: [id number]  HPI Anxiety- pt was started on Lexapro 10mg  daily.  'so far so good.  Definitely an improvement'.  Doesn't feel that she needs to increase the dose.  'Breathing problems' have resolved.  Feeling less on edge, less wound.  Sleeping well.  Husband has noticed an improvement.  She has also started therapy.  Since last visit she continues to have work stress, grandfather passed away, family stress is high- but she feels she is managing.   Review of Systems For ROS see HPI   This visit occurred during the SARS-CoV-2 public health emergency.  Safety protocols were in place, including screening questions prior to the visit, additional usage of staff PPE, and extensive cleaning of exam room while observing appropriate contact time as indicated for disinfecting solutions.       Objective:   Physical Exam Vitals reviewed.  Constitutional:      General: She is not in acute distress.    Appearance: Normal appearance. She is not ill-appearing.  HENT:     Head: Normocephalic and atraumatic.  Skin:    General: Skin is warm and dry.  Neurological:     General: No focal deficit present.     Mental Status: She is alert and oriented to person, place, and time.     Cranial Nerves: No cranial nerve deficit.     Motor: No weakness.     Gait: Gait normal.  Psychiatric:        Mood and Affect: Mood normal.        Behavior: Behavior normal.        Thought Content: Thought content normal.           Assessment & Plan:

## 2020-04-01 ENCOUNTER — Other Ambulatory Visit: Payer: Self-pay | Admitting: Family Medicine

## 2020-04-04 DIAGNOSIS — F4322 Adjustment disorder with anxiety: Secondary | ICD-10-CM | POA: Diagnosis not present

## 2020-04-04 DIAGNOSIS — J069 Acute upper respiratory infection, unspecified: Secondary | ICD-10-CM | POA: Diagnosis not present

## 2020-04-18 DIAGNOSIS — F4322 Adjustment disorder with anxiety: Secondary | ICD-10-CM | POA: Diagnosis not present

## 2020-05-04 DIAGNOSIS — F4322 Adjustment disorder with anxiety: Secondary | ICD-10-CM | POA: Diagnosis not present

## 2020-05-15 ENCOUNTER — Encounter: Payer: Self-pay | Admitting: Family Medicine

## 2020-05-22 DIAGNOSIS — F4322 Adjustment disorder with anxiety: Secondary | ICD-10-CM | POA: Diagnosis not present

## 2020-06-07 DIAGNOSIS — F4322 Adjustment disorder with anxiety: Secondary | ICD-10-CM | POA: Diagnosis not present

## 2020-06-08 ENCOUNTER — Other Ambulatory Visit: Payer: Self-pay | Admitting: Emergency Medicine

## 2020-06-08 DIAGNOSIS — F419 Anxiety disorder, unspecified: Secondary | ICD-10-CM

## 2020-06-08 MED ORDER — ESCITALOPRAM OXALATE 10 MG PO TABS: 10.0000 mg | ORAL_TABLET | Freq: Every day | ORAL | 2 refills | Status: DC

## 2020-06-08 MED FILL — ESCITALOPRAM 10 MG TABLET: 10 | 90 days supply | Qty: 90 | Fill #0

## 2020-06-20 DIAGNOSIS — Z20828 Contact with and (suspected) exposure to other viral communicable diseases: Secondary | ICD-10-CM | POA: Diagnosis not present

## 2020-06-21 DIAGNOSIS — F4322 Adjustment disorder with anxiety: Secondary | ICD-10-CM | POA: Diagnosis not present

## 2020-06-21 DIAGNOSIS — Z20822 Contact with and (suspected) exposure to covid-19: Secondary | ICD-10-CM | POA: Diagnosis not present

## 2020-07-05 DIAGNOSIS — F4323 Adjustment disorder with mixed anxiety and depressed mood: Secondary | ICD-10-CM | POA: Diagnosis not present

## 2020-07-14 ENCOUNTER — Encounter: Payer: Self-pay | Admitting: Family Medicine

## 2020-07-14 ENCOUNTER — Other Ambulatory Visit: Payer: Self-pay

## 2020-07-14 ENCOUNTER — Ambulatory Visit (INDEPENDENT_AMBULATORY_CARE_PROVIDER_SITE_OTHER): Payer: 59 | Admitting: Family Medicine

## 2020-07-14 ENCOUNTER — Other Ambulatory Visit: Payer: Self-pay | Admitting: Family Medicine

## 2020-07-14 DIAGNOSIS — Z0000 Encounter for general adult medical examination without abnormal findings: Secondary | ICD-10-CM | POA: Diagnosis not present

## 2020-07-14 DIAGNOSIS — F32A Depression, unspecified: Secondary | ICD-10-CM | POA: Insufficient documentation

## 2020-07-14 MED ORDER — ESCITALOPRAM OXALATE 20 MG PO TABS: 20.0000 mg | ORAL_TABLET | Freq: Every day | ORAL | 3 refills | Status: DC

## 2020-07-14 NOTE — Assessment & Plan Note (Signed)
Pt's PE WNL.  UTD on immunizations, pap.  No need for labs given lack of risk factors.  Anticipatory guidance provided.

## 2020-07-14 NOTE — Patient Instructions (Signed)
Follow up in 6 weeks to recheck mood INCREASE the Lexapro to 20mg  daily (2 of what you have at home and 1 of the new prescription) Keep up the good work on healthy diet and regular exercise- you look great! Call with any questions or concerns Stay Safe!  Stay Healthy!

## 2020-07-14 NOTE — Assessment & Plan Note (Signed)
New to provider.  Pt states she has been dealing w/ this on and off since HS.  Feels anxiety is in a good place but is definitely more aware of the depression.  Is in counseling at this time.  Will increase her Lexapro to 20mg  daily and monitor closely for improvement.  Next step would be to add medication or switch entirely.  Pt expressed understanding and is in agreement w/ plan.

## 2020-07-14 NOTE — Progress Notes (Signed)
   Subjective:    Patient ID: Jennifer Haley, female    DOB: 01/27/1996, 25 y.o.   MRN: [id number]  HPI CPE- UTD on pap, Tdap, flu, COVID  Reviewed past medical, surgical, family and social histories.   Health Maintenance  Topic Date Due  . Hepatitis C Screening  01/05/2021 (Originally [birth year])  . HIV Screening  01/05/2021 (Originally 08/21/2011)  . PAP-Cervical Cytology Screening  07/29/2021  . PAP SMEAR-Modifier  07/29/2021  . TETANUS/TDAP  01/11/2029  . INFLUENZA VACCINE  Completed  . COVID-19 Vaccine  Completed      Review of Systems Patient reports no vision/ hearing changes, adenopathy,fever, weight change,  persistant/recurrent hoarseness , swallowing issues, chest pain, palpitations, edema, persistant/recurrent cough, hemoptysis, dyspnea (rest/exertional/paroxysmal nocturnal), gastrointestinal bleeding (melena, rectal bleeding), abdominal pain, significant heartburn, bowel changes, GU symptoms (dysuria, hematuria, incontinence), Gyn symptoms (abnormal  bleeding, pain),  syncope, focal weakness, memory loss, numbness & tingling, skin/hair/nail changes, abnormal bruising or bleeding.    + depression- pt reports anxiety is well controlled.  Notes she is now having 'bouts of depression'.  At this point sxs will be 'a few days on and a few days off'.  PHQ9= 14.  Currently in counseling.  sxs date back to HS.  This visit occurred during the SARS-CoV-2 public health emergency.  Safety protocols were in place, including screening questions prior to the visit, additional usage of staff PPE, and extensive cleaning of exam room while observing appropriate contact time as indicated for disinfecting solutions.       Objective:   Physical Exam General Appearance:    Alert, cooperative, no distress, appears stated age  Head:    Normocephalic, without obvious abnormality, atraumatic  Eyes:    PERRL, conjunctiva/corneas clear, EOM's intact, fundi    benign, both eyes  Ears:    Normal TM's and  external ear canals, both ears  Nose:   Deferred due to COVID  Throat:   Neck:   Supple, symmetrical, trachea midline, no adenopathy;    Thyroid: no enlargement/tenderness/nodules  Back:     Symmetric, no curvature, ROM normal, no CVA tenderness  Lungs:     Clear to auscultation bilaterally, respirations unlabored  Chest Wall:    No tenderness or deformity   Heart:    Regular rate and rhythm, S1 and S2 normal, no murmur, rub   or gallop  Breast Exam:    Deferred to GYN  Abdomen:     Soft, non-tender, bowel sounds active all four quadrants,    no masses, no organomegaly  Genitalia:    Deferred to GYN  Rectal:    Extremities:   Extremities normal, atraumatic, no cyanosis or edema  Pulses:   2+ and symmetric all extremities  Skin:   Skin color, texture, turgor normal, no rashes or lesions  Lymph nodes:   Cervical, supraclavicular, and axillary nodes normal  Neurologic:   CNII-XII intact, normal strength, sensation and reflexes    throughout          Assessment & Plan:

## 2020-07-17 DIAGNOSIS — F4323 Adjustment disorder with mixed anxiety and depressed mood: Secondary | ICD-10-CM | POA: Diagnosis not present

## 2020-08-12 DIAGNOSIS — F4323 Adjustment disorder with mixed anxiety and depressed mood: Secondary | ICD-10-CM | POA: Diagnosis not present

## 2020-08-14 MED FILL — ESCITALOPRAM 20 MG TABLET: 20 | 30 days supply | Qty: 30 | Fill #0

## 2020-08-21 ENCOUNTER — Other Ambulatory Visit: Payer: Self-pay

## 2020-08-21 ENCOUNTER — Telehealth (INDEPENDENT_AMBULATORY_CARE_PROVIDER_SITE_OTHER): Payer: 59 | Admitting: Family Medicine

## 2020-08-21 ENCOUNTER — Encounter: Payer: Self-pay | Admitting: Family Medicine

## 2020-08-21 DIAGNOSIS — F32A Depression, unspecified: Secondary | ICD-10-CM | POA: Diagnosis not present

## 2020-08-21 NOTE — Progress Notes (Signed)
I have discussed the procedure for the virtual visit with the patient who has given consent to proceed with assessment and treatment.   Jennifer Haley, CMA     

## 2020-08-21 NOTE — Progress Notes (Signed)
   Virtual Visit via Video   I connected with patient on 08/21/20 at  2:00 PM EST by a video enabled telemedicine application and verified that I am speaking with the correct person using two identifiers.  Location patient: Home Location provider: [facility], Office Persons participating in the virtual visit: Patient, Provider, CMA (Patina M)  I discussed the limitations of evaluation and management by telemedicine and the availability of in person appointments. The patient expressed understanding and agreed to proceed.  Subjective:   HPI:   Depression- At last visit Lexapro was increased to 20mg  daily.  Pt reports mood is better.  Feeling 'a lot less depression overall'.  No longer sad.  Motivation level has improved.  Pt feels that currently medication is working well and no need to make adjustments.  No side effects noted.  ROS:   See pertinent positives and negatives per HPI.  Patient Active Problem List   Diagnosis Date Noted  . Depression 07/14/2020  . Physical exam 07/14/2020  . Anxiety 03/09/2020    Social History   Tobacco Use  . Smoking status: Never Smoker  . Smokeless tobacco: Never Used  Substance Use Topics  . Alcohol use: Yes    Comment: social    Current Outpatient Medications:  .  Ascorbic Acid (VITAMIN C PO), Take by mouth., Disp: , Rfl:  .  escitalopram (LEXAPRO) 20 MG tablet, Take 1 tablet (20 mg total) by mouth daily., Disp: 30 tablet, Rfl: 3 .  ibuprofen (ADVIL,MOTRIN) 200 MG tablet, Take 400 mg by mouth every 6 (six) hours as needed for headache, mild pain or moderate pain., Disp: , Rfl:  .  OVER THE COUNTER MEDICATION, Pamprin--menstrual pain relief, Disp: , Rfl:   Allergies  Allergen Reactions  . Adhesive [Tape] Rash  . Amoxicillin Rash    *childhood allergy* pt does not recall any specific details about reaction   . Latex Rash    Objective:   There were no vitals taken for this visit.  AAOx3, NAD NCAT, EOMI No obvious CN  deficits Coloring WNL Pt is able to speak clearly, coherently without shortness of breath or increased work of breathing.  Thought process is linear.  Mood is appropriate.   Assessment and Plan:   Depression- improved w/ increased dose of Lexapro.  No side effects at this time and pt feels overall mood and motivation have improved.  Not interested in med changes at this time.  She continues to go to counseling regularly.  Will continue to follow.   03/11/2020, MD 08/21/2020

## 2020-08-24 DIAGNOSIS — F4323 Adjustment disorder with mixed anxiety and depressed mood: Secondary | ICD-10-CM | POA: Diagnosis not present

## 2020-09-06 DIAGNOSIS — F4323 Adjustment disorder with mixed anxiety and depressed mood: Secondary | ICD-10-CM | POA: Diagnosis not present

## 2020-09-13 MED FILL — ESCITALOPRAM 20 MG TABLET: 20 | 30 days supply | Qty: 30 | Fill #1

## 2020-09-20 DIAGNOSIS — F4323 Adjustment disorder with mixed anxiety and depressed mood: Secondary | ICD-10-CM | POA: Diagnosis not present

## 2020-10-03 DIAGNOSIS — J309 Allergic rhinitis, unspecified: Secondary | ICD-10-CM | POA: Diagnosis not present

## 2020-10-03 DIAGNOSIS — R059 Cough, unspecified: Secondary | ICD-10-CM | POA: Diagnosis not present

## 2020-10-06 ENCOUNTER — Emergency Department (INDEPENDENT_AMBULATORY_CARE_PROVIDER_SITE_OTHER): Payer: 59

## 2020-10-06 ENCOUNTER — Telehealth: Payer: Self-pay | Admitting: Emergency Medicine

## 2020-10-06 ENCOUNTER — Emergency Department
Admission: RE | Admit: 2020-10-06 | Discharge: 2020-10-06 | Disposition: A | Discharge status: Home / Self Care | Payer: 59 | Source: Ambulatory Visit | Attending: Family Medicine | Admitting: Family Medicine

## 2020-10-06 ENCOUNTER — Other Ambulatory Visit: Payer: Self-pay

## 2020-10-06 VITALS — BP 121/74 | HR 78 | Temp 100.1°F | Resp 16 | Ht 63.0 in | Wt 135.0 lb

## 2020-10-06 DIAGNOSIS — R509 Fever, unspecified: Secondary | ICD-10-CM

## 2020-10-06 DIAGNOSIS — R059 Cough, unspecified: Secondary | ICD-10-CM | POA: Diagnosis not present

## 2020-10-06 DIAGNOSIS — J209 Acute bronchitis, unspecified: Secondary | ICD-10-CM | POA: Diagnosis not present

## 2020-10-06 MED ORDER — DOXYCYCLINE HYCLATE 100 MG PO CAPS: ORAL_CAPSULE | ORAL | 0 refills | Status: DC

## 2020-10-06 NOTE — Discharge Instructions (Signed)
Take plain guaifenesin (1200mg extended release tabs such as Mucinex) twice daily, with plenty of water, for cough and congestion. Get adequate rest.   May take Delsym Cough Suppressant ("12 Hour Cough Relief") at bedtime for nighttime cough.  Stop all antihistamines for now, and other non-prescription cough/cold preparations.     

## 2020-10-06 NOTE — Telephone Encounter (Signed)
Call from [place] -current pharmacy closed - RN to call in prescription for [facility]xycycl[facility] to [facility] on [address] - done at 1950- will cancel script at [facility] in [place]- call back at 1951 to pt to pick up [facility]cycl[facility]. Pt verbalized an understanding.

## 2020-10-06 NOTE — ED Provider Notes (Signed)
[facility]    CSN: [id number] Arrival date & time: 10/06/20  1725      History   Chief Complaint Chief Complaint  Patient presents with  . Cough    HPI Jennifer Haley is a 25 y.o. female.   Patient complains of having allergy symptoms for about a month, but did not feel ill.  Eight days ago she developed a non-productive cough that has persisted.  Today she developed low grade fever and fatigue, but denies pleuritic pain or shortness of breath.  The history is provided by the patient.    History reviewed. No pertinent past medical history.  Patient Active Problem List   Diagnosis Date Noted  . Depression 07/14/2020  . Physical exam 07/14/2020  . Anxiety 03/09/2020    Past Surgical History:  Procedure Laterality Date  . MANDIBLE FRACTURE SURGERY      OB History   No obstetric history on file.      Home Medications    Prior to Admission medications   Medication Sig Start Date End Date Taking? Authorizing Provider  Ascorbic Acid (VITAMIN C PO) Take by mouth.   Yes [provider]  benzonatate (TESSALON) 200 MG capsule Take by mouth. 10/03/20 10/10/20 Yes [provider]  doxycycline (VIBRAMYCIN) 100 MG capsule Take one cap PO Q12hr with food. 10/06/20  Yes Lattie Haw, MD  escitalopram (LEXAPRO) 20 MG tablet TAKE 1 TABLET BY MOUTH ONCE DAILY 07/14/20 07/14/21 Yes Sheliah Hatch, MD  ibuprofen (ADVIL,MOTRIN) 200 MG tablet Take 400 mg by mouth every 6 (six) hours as needed for headache, mild pain or moderate pain.   Yes [provider]  OVER THE COUNTER MEDICATION Pamprin--menstrual pain relief   Yes [provider]    Family History Family History  Problem Relation Age of Onset  . Asthma Brother   . Heart disease Paternal Grandfather   . Cancer Paternal Grandfather        lung  . Glaucoma Paternal Grandfather   . Healthy Mother   . Healthy Father   . Hearing loss Maternal Grandmother     Social  History Social History   Tobacco Use  . Smoking status: Never Smoker  . Smokeless tobacco: Never Used  Vaping Use  . Vaping Use: Never used  Substance Use Topics  . Alcohol use: Yes    Comment: social  . Drug use: No     Allergies   Adhesive [tape], Amoxicillin, and Latex   Review of Systems Review of Systems No sore throat + cough No pleuritic pain No wheezing + nasal congestion + post-nasal drainage No sinus pain/pressure No itchy/red eyes No earache No hemoptysis No SOB + fever No nausea No vomiting No abdominal pain No diarrhea No urinary symptoms No skin rash + fatigue No myalgias No headache   Physical Exam Triage Vital Signs ED Triage Vitals  Enc Vitals Group     BP 10/06/20 1755 121/74     Pulse Rate 10/06/20 1755 78     Resp 10/06/20 1755 16     Temp 10/06/20 1755 100.2 F (37.9 C)     Temp Source 10/06/20 1755 Oral     SpO2 10/06/20 1755 99 %     Weight 10/06/20 1758 135 lb (61.2 kg)     Height 10/06/20 1758 5\' 3"  (1.6 m)     Head Circumference --      Peak Flow --      Pain Score 10/06/20 1757 0  Pain Loc --      Pain Edu? --      Excl. in GC? --    No data found.  Updated Vital Signs BP 121/74 (BP Location: Right Arm)   Pulse 78   Temp 100.2 F (37.9 C) (Oral)   Resp 16   Ht 5\' 3"  (1.6 m)   Wt 61.2 kg   LMP 09/14/2020 (Exact Date)   SpO2 99%   BMI 23.91 kg/m   Visual Acuity Right Eye Distance:   Left Eye Distance:   Bilateral Distance:    Right Eye Near:   Left Eye Near:    Bilateral Near:     Physical Exam Nursing notes and Vital Signs reviewed. Appearance:  Patient appears stated age, and in no acute distress Eyes:  Pupils are equal, round, and reactive to light and accomodation.  Extraocular movement is intact.  Conjunctivae are not inflamed  Ears:  Canals normal.  Tympanic membranes normal.  Nose:  Mildly congested turbinates.  No sinus tenderness.   Pharynx:  Normal Neck:  Supple.  No adenopathy    Lungs:  Clear to auscultation.  Breath sounds are equal.  Moving air well. Heart:  Regular rate and rhythm without murmurs, rubs, or gallops.  Abdomen:  Nontender without masses or hepatosplenomegaly.  Bowel sounds are present.  No CVA or flank tenderness.  Extremities:  No edema.  Skin:  No rash present.   UC Treatments / Results  Labs (all labs ordered are listed, but only abnormal results are displayed) Labs Reviewed - No data to display  EKG   Radiology DG Chest 2 View  Result Date: 10/06/2020 CLINICAL DATA:  Fever and cough EXAM: CHEST - 2 VIEW COMPARISON:  None. FINDINGS: The heart size and mediastinal contours are within normal limits. Both lungs are clear. The visualized skeletal structures are unremarkable. IMPRESSION: No active cardiopulmonary disease. Electronically Signed   By: 10/08/2020 M.D.   On: 10/06/2020 18:39    Procedures Procedures (including critical care time)  Medications Ordered in UC Medications - No data to display  Initial Impression / Assessment and Plan / UC Course  I have reviewed the triage vital signs and the nursing notes.  Pertinent labs & imaging results that were available during my care of the patient were reviewed by me and considered in my medical decision making (see chart for details).    Negative chest x-ray reassuring. Begin empiric doxycycline for coverage possible atypicals. Followup with Family Doctor if not improved in one week.    Final Clinical Impressions(s) / UC Diagnoses   Final diagnoses:  Acute bronchitis, unspecified organism     Discharge Instructions     Take plain guaifenesin (1200mg  extended release tabs such as Mucinex) twice daily, with plenty of water, for cough and congestion.  Get adequate rest.   May take Delsym Cough Suppressant ("12 Hour Cough Relief") at bedtime for nighttime cough.  Stop all antihistamines for now, and other non-prescription cough/cold preparations.      ED Prescriptions     Medication Sig Dispense Auth. Provider   doxycycline (VIBRAMYCIN) 100 MG capsule Take one cap PO Q12hr with food. 14 capsule 10/08/2020, MD        , MD 10/07/20 [phone]

## 2020-10-06 NOTE — ED Triage Notes (Signed)
Cough x 10 days - treated w/ kenalog on Tuesday - taking tessalon - min relief  No fever until today

## 2020-10-16 ENCOUNTER — Other Ambulatory Visit (HOSPITAL_BASED_OUTPATIENT_CLINIC_OR_DEPARTMENT_OTHER): Payer: Self-pay

## 2020-10-16 MED FILL — Escitalopram Oxalate Tab 20 MG (Base Equiv): ORAL | 30 days supply | Qty: 30 | Fill #0 | Status: AC

## 2020-10-25 DIAGNOSIS — F4323 Adjustment disorder with mixed anxiety and depressed mood: Secondary | ICD-10-CM | POA: Diagnosis not present

## 2020-10-30 ENCOUNTER — Encounter: Payer: Self-pay | Admitting: Family Medicine

## 2020-11-08 DIAGNOSIS — F4323 Adjustment disorder with mixed anxiety and depressed mood: Secondary | ICD-10-CM | POA: Diagnosis not present

## 2020-11-20 ENCOUNTER — Other Ambulatory Visit (HOSPITAL_BASED_OUTPATIENT_CLINIC_OR_DEPARTMENT_OTHER): Payer: Self-pay

## 2020-11-20 MED FILL — Escitalopram Oxalate Tab 20 MG (Base Equiv): ORAL | 30 days supply | Qty: 30 | Fill #1 | Status: AC

## 2020-11-29 DIAGNOSIS — F4323 Adjustment disorder with mixed anxiety and depressed mood: Secondary | ICD-10-CM | POA: Diagnosis not present

## 2020-12-13 ENCOUNTER — Encounter: Payer: Self-pay | Admitting: *Deleted

## 2020-12-13 DIAGNOSIS — F4323 Adjustment disorder with mixed anxiety and depressed mood: Secondary | ICD-10-CM | POA: Diagnosis not present

## 2020-12-15 DIAGNOSIS — H5213 Myopia, bilateral: Secondary | ICD-10-CM | POA: Diagnosis not present

## 2020-12-21 ENCOUNTER — Encounter: Payer: Self-pay | Admitting: Family Medicine

## 2020-12-27 DIAGNOSIS — F4323 Adjustment disorder with mixed anxiety and depressed mood: Secondary | ICD-10-CM | POA: Diagnosis not present

## 2020-12-29 ENCOUNTER — Other Ambulatory Visit (HOSPITAL_BASED_OUTPATIENT_CLINIC_OR_DEPARTMENT_OTHER): Payer: Self-pay

## 2020-12-29 ENCOUNTER — Other Ambulatory Visit: Payer: Self-pay | Admitting: Family Medicine

## 2020-12-29 MED ORDER — ESCITALOPRAM OXALATE 20 MG PO TABS
ORAL_TABLET | Freq: Every day | ORAL | 3 refills | Status: DC
  Filled 2020-12-29: qty 30, 30d supply, fill #0
  Filled 2021-02-07: qty 30, 30d supply, fill #1
  Filled 2021-03-07: qty 30, 30d supply, fill #2
  Filled 2021-04-10: qty 30, 30d supply, fill #3

## 2021-02-07 ENCOUNTER — Other Ambulatory Visit (HOSPITAL_BASED_OUTPATIENT_CLINIC_OR_DEPARTMENT_OTHER): Payer: Self-pay

## 2021-03-07 ENCOUNTER — Other Ambulatory Visit (HOSPITAL_BASED_OUTPATIENT_CLINIC_OR_DEPARTMENT_OTHER): Payer: Self-pay

## 2021-04-10 ENCOUNTER — Other Ambulatory Visit (HOSPITAL_BASED_OUTPATIENT_CLINIC_OR_DEPARTMENT_OTHER): Payer: Self-pay

## 2021-05-09 ENCOUNTER — Other Ambulatory Visit: Payer: Self-pay | Admitting: Family Medicine

## 2021-05-09 ENCOUNTER — Other Ambulatory Visit (HOSPITAL_BASED_OUTPATIENT_CLINIC_OR_DEPARTMENT_OTHER): Payer: Self-pay

## 2021-05-09 MED ORDER — ESCITALOPRAM OXALATE 20 MG PO TABS
20.0000 mg | ORAL_TABLET | Freq: Every day | ORAL | 3 refills | Status: DC
  Filled 2021-05-09: qty 30, 30d supply, fill #0
  Filled 2021-06-13: qty 30, 30d supply, fill #1
  Filled 2021-07-13: qty 30, 30d supply, fill #2
  Filled 2021-08-13: qty 30, 30d supply, fill #3

## 2021-06-13 ENCOUNTER — Other Ambulatory Visit (HOSPITAL_BASED_OUTPATIENT_CLINIC_OR_DEPARTMENT_OTHER): Payer: Self-pay

## 2021-07-13 ENCOUNTER — Other Ambulatory Visit (HOSPITAL_BASED_OUTPATIENT_CLINIC_OR_DEPARTMENT_OTHER): Payer: Self-pay

## 2021-08-12 ENCOUNTER — Encounter (HOSPITAL_COMMUNITY): Payer: Self-pay

## 2021-08-12 ENCOUNTER — Other Ambulatory Visit: Payer: Self-pay

## 2021-08-12 ENCOUNTER — Emergency Department (HOSPITAL_COMMUNITY): Payer: 59

## 2021-08-12 ENCOUNTER — Emergency Department (HOSPITAL_COMMUNITY)
Admission: EM | Admit: 2021-08-12 | Discharge: 2021-08-12 | Disposition: A | Discharge status: Home / Self Care | Payer: 59 | Attending: Emergency Medicine | Admitting: Emergency Medicine

## 2021-08-12 DIAGNOSIS — R42 Dizziness and giddiness: Secondary | ICD-10-CM | POA: Diagnosis not present

## 2021-08-12 DIAGNOSIS — R110 Nausea: Secondary | ICD-10-CM | POA: Diagnosis not present

## 2021-08-12 DIAGNOSIS — R9431 Abnormal electrocardiogram [ECG] [EKG]: Secondary | ICD-10-CM | POA: Diagnosis not present

## 2021-08-12 DIAGNOSIS — R112 Nausea with vomiting, unspecified: Secondary | ICD-10-CM | POA: Diagnosis not present

## 2021-08-12 DIAGNOSIS — R1084 Generalized abdominal pain: Secondary | ICD-10-CM | POA: Diagnosis not present

## 2021-08-12 DIAGNOSIS — I959 Hypotension, unspecified: Secondary | ICD-10-CM | POA: Diagnosis not present

## 2021-08-12 DIAGNOSIS — R197 Diarrhea, unspecified: Secondary | ICD-10-CM | POA: Diagnosis not present

## 2021-08-12 DIAGNOSIS — Z79899 Other long term (current) drug therapy: Secondary | ICD-10-CM | POA: Insufficient documentation

## 2021-08-12 DIAGNOSIS — R102 Pelvic and perineal pain: Secondary | ICD-10-CM | POA: Diagnosis not present

## 2021-08-12 DIAGNOSIS — Z91040 Latex allergy status: Secondary | ICD-10-CM | POA: Diagnosis not present

## 2021-08-12 DIAGNOSIS — R1030 Lower abdominal pain, unspecified: Secondary | ICD-10-CM | POA: Insufficient documentation

## 2021-08-12 DIAGNOSIS — I10 Essential (primary) hypertension: Secondary | ICD-10-CM | POA: Diagnosis not present

## 2021-08-12 LAB — I-STAT BETA HCG BLOOD, ED (MC, WL, AP ONLY): I-stat hCG, quantitative: <5 m[IU]/mL (ref <5)

## 2021-08-12 LAB — COMPREHENSIVE METABOLIC PANEL
ALT: 15 U/L (ref 0–44)
AST: 18 U/L (ref 15–41)
Albumin: 4.4 g/dL (ref 3.5–5.0)
Alkaline Phosphatase: 30 U/L — ABNORMAL LOW (ref 38–126)
Anion gap: 4 — ABNORMAL LOW (ref 5–15)
BUN: 11 mg/dL (ref 6–20)
CO2: 26 mmol/L (ref 22–32)
Calcium: 8.8 mg/dL — ABNORMAL LOW (ref 8.9–10.3)
Chloride: 106 mmol/L (ref 98–111)
Creatinine, Ser: 0.77 mg/dL (ref 0.44–1.00)
GFR, Estimated: >60 mL/min (ref >60)
Glucose, Bld: 120 mg/dL — ABNORMAL HIGH (ref 70–99)
Potassium: 3.7 mmol/L (ref 3.5–5.1)
Sodium: 136 mmol/L (ref 135–145)
Total Bilirubin: 0.5 mg/dL (ref 0.3–1.2)
Total Protein: 6.9 g/dL (ref 6.5–8.1)

## 2021-08-12 LAB — URINALYSIS, MICROSCOPIC (REFLEX): RBC / HPF: >50 RBC/hpf (ref 0–5)

## 2021-08-12 LAB — CBC WITH DIFFERENTIAL/PLATELET
Abs Immature Granulocytes: 0.03 10*3/uL (ref 0.00–0.07)
Basophils Absolute: 0 10*3/uL (ref 0.0–0.1)
Basophils Relative: 0 %
Eosinophils Absolute: 0.1 10*3/uL (ref 0.0–0.5)
Eosinophils Relative: 1 %
HCT: 39.3 % (ref 36.0–46.0)
Hemoglobin: 13.1 g/dL (ref 12.0–15.0)
Immature Granulocytes: 0 %
Lymphocytes Relative: 10 %
Lymphs Abs: 1 10*3/uL (ref 0.7–4.0)
MCH: 30.3 pg (ref 26.0–34.0)
MCHC: 33.3 g/dL (ref 30.0–36.0)
MCV: 91 fL (ref 80.0–100.0)
Monocytes Absolute: 0.6 10*3/uL (ref 0.1–1.0)
Monocytes Relative: 6 %
Neutro Abs: 8.7 10*3/uL — ABNORMAL HIGH (ref 1.7–7.7)
Neutrophils Relative %: 83 %
Platelets: 245 10*3/uL (ref 150–400)
RBC: 4.32 MIL/uL (ref 3.87–5.11)
RDW: 11.8 % (ref 11.5–15.5)
WBC: 10.5 10*3/uL (ref 4.0–10.5)
nRBC: 0 % (ref 0.0–0.2)

## 2021-08-12 LAB — URINALYSIS, ROUTINE W REFLEX MICROSCOPIC

## 2021-08-12 LAB — LIPASE, BLOOD: Lipase: 31 U/L (ref 11–51)

## 2021-08-12 MED ORDER — SODIUM CHLORIDE 0.9 % IV BOLUS
1000.0000 mL | Freq: Once | INTRAVENOUS | Status: AC
  Administered 2021-08-12: 1000 mL via INTRAVENOUS

## 2021-08-12 MED ORDER — ONDANSETRON HCL 4 MG/2ML IJ SOLN
4.0000 mg | Freq: Once | INTRAMUSCULAR | Status: DC
  Filled 2021-08-12: qty 2

## 2021-08-12 NOTE — ED Provider Notes (Signed)
[facility]-EMERGENCY DEPT Provider Note   CSN: [id number] Arrival date & time: 08/12/21  1355     History  Chief Complaint  Patient presents with   Nausea   Diarrhea    Jennifer Haley is a 26 y.o. female. The patient presents to the emergency room complaining of nausea and diarrhea that started at 12:30pm today. She states that she began her period this morning with no irregularities. She took pamprin for cramps and her Lexapro. She had severe lower abdominal pain described as being across her lower abdomen. She then had 4 episodes of diarrhea and nausea. She denies emesis. PMH significant for anxiety and depression.  HPI     Home Medications Prior to Admission medications   Medication Sig Start Date End Date Taking? Authorizing Provider  Ascorbic Acid (VITAMIN C PO) Take by mouth.    [provider]  escitalopram (LEXAPRO) 20 MG tablet Take 1 tablet (20 mg total) by mouth daily. 05/09/21 05/09/22  Sheliah Hatch, MD  ibuprofen (ADVIL,MOTRIN) 200 MG tablet Take 400 mg by mouth every 6 (six) hours as needed for headache, mild pain or moderate pain.    [provider]  OVER THE COUNTER MEDICATION Pamprin--menstrual pain relief    [provider]      Allergies    Adhesive [tape], Amoxicillin, and Latex    Review of Systems   Review of Systems  Constitutional:  Negative for fever.  Respiratory:  Negative for shortness of breath.   Cardiovascular:  Negative for chest pain.  Gastrointestinal:  Positive for abdominal pain, diarrhea and nausea. Negative for vomiting.  Genitourinary:  Negative for dysuria.  Neurological:  Negative for syncope.   Physical Exam Updated Vital Signs BP 105/61    Pulse 73    Temp 98 F (36.7 C) (Oral)    Resp 18    Ht 5\' 3"  (1.6 m)    Wt 61.2 kg    LMP 08/12/2021    SpO2 99%    BMI 23.91 kg/m  Physical Exam Constitutional:      General: She is not in acute distress. HENT:     Head:  Normocephalic.  Eyes:     Conjunctiva/sclera: Conjunctivae normal.  Cardiovascular:     Rate and Rhythm: Normal rate and regular rhythm.     Pulses: Normal pulses.     Heart sounds: Normal heart sounds.  Pulmonary:     Effort: Pulmonary effort is normal.     Breath sounds: Normal breath sounds.  Abdominal:     Palpations: Abdomen is soft.     Tenderness: There is no abdominal tenderness.  Musculoskeletal:     Cervical back: Normal range of motion.  Skin:    General: Skin is warm and dry.  Neurological:     Mental Status: She is alert.    ED Results / Procedures / Treatments   Labs (all labs ordered are listed, but only abnormal results are displayed) Labs Reviewed  URINALYSIS, ROUTINE W REFLEX MICROSCOPIC - Abnormal; Notable for the following components:      Result Value   Color, Urine RED (*)    APPearance TURBID (*)    Glucose, UA   (*)    Value: TEST NOT REPORTED DUE TO COLOR INTERFERENCE OF URINE PIGMENT   Hgb urine dipstick   (*)    Value: TEST NOT REPORTED DUE TO COLOR INTERFERENCE OF URINE PIGMENT   Bilirubin Urine   (*)    Value: TEST NOT REPORTED  DUE TO COLOR INTERFERENCE OF URINE PIGMENT   Ketones, ur   (*)    Value: TEST NOT REPORTED DUE TO COLOR INTERFERENCE OF URINE PIGMENT   Protein, ur   (*)    Value: TEST NOT REPORTED DUE TO COLOR INTERFERENCE OF URINE PIGMENT   Nitrite   (*)    Value: TEST NOT REPORTED DUE TO COLOR INTERFERENCE OF URINE PIGMENT   Leukocytes,Ua   (*)    Value: TEST NOT REPORTED DUE TO COLOR INTERFERENCE OF URINE PIGMENT   All other components within normal limits  CBC WITH DIFFERENTIAL/PLATELET - Abnormal; Notable for the following components:   Neutro Abs 8.7 (*)    All other components within normal limits  COMPREHENSIVE METABOLIC PANEL - Abnormal; Notable for the following components:   Glucose, Bld 120 (*)    Calcium 8.8 (*)    Alkaline Phosphatase 30 (*)    Anion gap 4 (*)    All other components within normal limits   URINALYSIS, MICROSCOPIC (REFLEX) - Abnormal; Notable for the following components:   Bacteria, UA FEW (*)    All other components within normal limits  LIPASE, BLOOD  I-STAT BETA HCG BLOOD, ED ([facility], [facility], [facility] ONLY)    EKG EKG Interpretation  Date/Time:  Sunday August 12 2021 14:34:31 EST Ventricular Rate:  51 PR Interval:    QRS Duration: 91 QT Interval:  447 QTC Calculation: 412 R Axis:   72 Text Interpretation: Junctional rhythm Artifact Abnormal ECG Confirmed by Gerhard Munch [id number]) on 08/12/2021 3:36:56 PM  Radiology US PELVIC COMPLETE W TRANSVAGINAL AND TORSION R/O  Result Date: 08/12/2021 CLINICAL DATA:  Pelvic pain EXAM: TRANSABDOMINAL AND TRANSVAGINAL ULTRASOUND OF PELVIS DOPPLER ULTRASOUND OF OVARIES TECHNIQUE: Both transabdominal and transvaginal ultrasound examinations of the pelvis were performed. Transabdominal technique was performed for global imaging of the pelvis including uterus, ovaries, adnexal regions, and pelvic cul-de-sac. It was necessary to proceed with endovaginal exam following the transabdominal exam to visualize the ovaries and endometrium. Color and duplex Doppler ultrasound was utilized to evaluate blood flow to the ovaries. COMPARISON:  None. FINDINGS: Uterus Measurements: 8.0 x 3.6 x 4.4 cm = volume: 63 mL. No fibroids or other mass visualized. Endometrium Thickness: 8.0.  Normal thickness for premenopausal female Right ovary Measurements: 2.9 x 1.5 x 2.8 cm = volume: 6.6 mL. Normal small follicles Left ovary Measurements: 2.5 x 2.0 x 2.3 cm = volume: 5.8 mL. Normal small follicles. Pulsed Doppler evaluation of both ovaries demonstrates normal low-resistance arterial and venous waveforms. Other findings No abnormal free fluid. IMPRESSION: Normal uterus and ovaries. Electronically Signed   By: Genevive Bi M.D.   On: 08/12/2021 16:15    Procedures Procedures    Medications Ordered in ED Medications  ondansetron (ZOFRAN) injection 4 mg (4 mg  Intravenous Not Given 08/12/21 1511)  sodium chloride 0.9 % bolus 1,000 mL (1,000 mLs Intravenous New Bag/Given 08/12/21 1430)    ED Course/ Medical Decision Making/ A&P Clinical Course as of 08/12/21 1720  Sun Aug 12, 2021  1623 Nitrite(!): TEST NOT REPORTED DUE TO COLOR INTERFERENCE OF URINE PIGMENT [LM]    Clinical Course User Index [LM] Darrick Grinder, [place]                           Medical Decision Making  This patient presents to the ED for concern of nausea and diarrhea, this involves an extensive number of treatment options, and is a complaint that  carries with it a high risk of complications and morbidity.  The differential diagnosis includes but is not limited to viral illness, ectopic pregnancy, ovarian cyst, ovarian torsion,biliary colic, cholecystitis and others   Co morbidities that complicate the patient evaluation  Anxiety   Additional history obtained:  Additional history obtained from patient's partner   Lab Tests:  I Ordered, and personally interpreted labs.  The pertinent results include:  Few bacteria on urinalysis   Imaging Studies ordered:  I ordered imaging studies including US pelvic complete w/ transvaginal   I independently visualized and interpreted imaging which showed normal uterus and ovaries I agree with the radiologist interpretation   Cardiac Monitoring:  The patient was maintained on a cardiac monitor.  I personally viewed and interpreted the cardiac monitored which showed an underlying rhythm of: Sinus rhythm   Medicines ordered and prescription drug management:  I ordered medication including zofran for nausea  The patient decided to decline the medication I have reviewed the patients home medicines and have made adjustments as needed   Test Considered:  CT abdomen   Reevaluation:  After the interventions noted above, I reevaluated the patient and found that they have :improved   Dispostion:  After consideration of  the diagnostic results and the patients response to treatment, I feel that the patent would benefit from discharge home. The patient's lab work was unremarkable. She has had no abdominal pain since arrival which makes biliary etiology or appendicitis unlikely. Pregnancy test was negative. She was evaluated by ultrasound and no torsion or cyst was noted. This is likely a viral illness. I discussed with the patient the potential to evaluate with an abdominal CT scan. There are no alarm symptoms that necessitate a CT but it would be reasonable to rule out other conditions. We discussed the radiation exposure, discussed pros and cons. The patient decided against CT abdomen at this time which seems very reasonable. The patient continues to have some intermittent nausea but is feeling better overall. I see no reason for admission and think she can safely discharge home. I offered Zofran but the patient declined the prescription. Return precautions provided.     Final Clinical Impression(s) / ED Diagnoses Final diagnoses:  Pelvic pain  Nausea    Rx / DC Orders ED Discharge Orders     None         Darrick Grinder, [place] 08/12/21 Carroll Sage, MD 08/12/21 2003

## 2021-08-12 NOTE — Discharge Instructions (Addendum)
The patient presents with a complaint of nausea and diarrhea. Lab work shows no signs of infection. The ultrasound shows a normal uterus and ovaries. You likely have a viral condition. If the nausea and diarrhea continue you can follow up with your primary care provider. You should return to the emergency department immediately if you develop life threatening conditions, such as severe abdominal pain, chest pain, shortness of breath, or altered level of consciousness

## 2021-08-12 NOTE — ED Triage Notes (Signed)
Patient presented to the ED with c/o  abdominal cramp with nausea and diarrhea started this morning. Abdominal pain 2/10 currently worse before coming in.

## 2021-08-13 ENCOUNTER — Other Ambulatory Visit (HOSPITAL_BASED_OUTPATIENT_CLINIC_OR_DEPARTMENT_OTHER): Payer: Self-pay

## 2021-08-31 ENCOUNTER — Ambulatory Visit (INDEPENDENT_AMBULATORY_CARE_PROVIDER_SITE_OTHER): Payer: 59 | Admitting: Family Medicine

## 2021-08-31 ENCOUNTER — Encounter: Payer: Self-pay | Admitting: Family Medicine

## 2021-08-31 VITALS — BP 118/68 | HR 86 | Temp 98.2°F | Resp 16 | Ht 63.0 in | Wt 140.2 lb

## 2021-08-31 DIAGNOSIS — Z124 Encounter for screening for malignant neoplasm of cervix: Secondary | ICD-10-CM | POA: Diagnosis not present

## 2021-08-31 DIAGNOSIS — Z0000 Encounter for general adult medical examination without abnormal findings: Secondary | ICD-10-CM | POA: Diagnosis not present

## 2021-08-31 NOTE — Patient Instructions (Signed)
Follow up in 1 year or as needed ?No need for labs today!  You look great!!! ?We'll call you with your GYN appt ?Call with any questions or concerns ?Stay Safe!  Stay Healthy! ?Happy Belated Birthday!!! ?

## 2021-08-31 NOTE — Assessment & Plan Note (Signed)
Pt's PE WNL.  Due for pap- pt would like to establish w/ GYN.  Referral placed.  UTD on Tdap, flu.  No need for labs- reviewed those done in ER.  Anticipatory guidance provided.  ?

## 2021-08-31 NOTE — Progress Notes (Signed)
? ?  Subjective:  ? ? Patient ID: Jennifer Haley, female    DOB: 04-22-1996, 26 y.o.   MRN: [id number] ? ?HPI ?CPE- due for repeat pap.  UTD on Tdap, flu.  No concerns today. ? ?Health Maintenance  ?Topic Date Due  ? HIV Screening  Never done  ? Hepatitis C Screening  Never done  ? PAP-Cervical Cytology Screening  07/29/2021  ? PAP SMEAR-Modifier  07/29/2021  ? COVID-19 Vaccine (4 - Booster for Pfizer series) 09/16/2021 (Originally 07/27/2020)  ? TETANUS/TDAP  01/11/2029  ? INFLUENZA VACCINE  Completed  ? HPV VACCINES  Completed  ?  ? ? ?Review of Systems ?Patient reports no vision/ hearing changes, adenopathy,fever, weight change,  persistant/recurrent hoarseness , swallowing issues, chest pain, palpitations, edema, persistant/recurrent cough, hemoptysis, dyspnea (rest/exertional/paroxysmal nocturnal), gastrointestinal bleeding (melena, rectal bleeding), abdominal pain, significant heartburn, bowel changes, GU symptoms (dysuria, hematuria, incontinence), Gyn symptoms (abnormal  bleeding, pain),  syncope, focal weakness, memory loss, numbness & tingling, skin/hair/nail changes, abnormal bruising or bleeding, anxiety, or depression.  ? ?This visit occurred during the SARS-CoV-2 public health emergency.  Safety protocols were in place, including screening questions prior to the visit, additional usage of staff PPE, and extensive cleaning of exam room while observing appropriate contact time as indicated for disinfecting solutions.   ?   ?Objective:  ? Physical Exam ?General Appearance:    Alert, cooperative, no distress, appears stated age  ?Head:    Normocephalic, without obvious abnormality, atraumatic  ?Eyes:    PERRL, conjunctiva/corneas clear, EOM's intact, fundi  ?  benign, both eyes  ?Ears:    Normal TM's and external ear canals, both ears  ?Nose:   Deferred due to COVID  ?Throat:   ?Neck:   Supple, symmetrical, trachea midline, no adenopathy;  ?  Thyroid: no enlargement/tenderness/nodules  ?Back:     Symmetric, no  curvature, ROM normal, no CVA tenderness  ?Lungs:     Clear to auscultation bilaterally, respirations unlabored  ?Chest Wall:    No tenderness or deformity  ? Heart:    Regular rate and rhythm, S1 and S2 normal, no murmur, rub ?  or gallop  ?Breast Exam:    Deferred to GYN  ?Abdomen:     Soft, non-tender, bowel sounds active all four quadrants,  ?  no masses, no organomegaly  ?Genitalia:    Deferred to GYN  ?Rectal:    ?Extremities:   Extremities normal, atraumatic, no cyanosis or edema  ?Pulses:   2+ and symmetric all extremities  ?Skin:   Skin color, texture, turgor normal, no rashes or lesions  ?Lymph nodes:   Cervical, supraclavicular, and axillary nodes normal  ?Neurologic:   CNII-XII intact, normal strength, sensation and reflexes  ?  throughout  ?  ? ? ? ?   ?Assessment & Plan:  ? ? ?

## 2021-09-14 ENCOUNTER — Other Ambulatory Visit: Payer: Self-pay | Admitting: Family Medicine

## 2021-09-14 ENCOUNTER — Other Ambulatory Visit (HOSPITAL_BASED_OUTPATIENT_CLINIC_OR_DEPARTMENT_OTHER): Payer: Self-pay

## 2021-09-14 MED ORDER — ESCITALOPRAM OXALATE 20 MG PO TABS
20.0000 mg | ORAL_TABLET | Freq: Every day | ORAL | 3 refills | Status: DC
  Filled 2021-09-14: qty 30, 30d supply, fill #0
  Filled 2021-10-12: qty 30, 30d supply, fill #1
  Filled 2021-11-14: qty 30, 30d supply, fill #2
  Filled 2021-12-13: qty 30, 30d supply, fill #3

## 2021-10-12 ENCOUNTER — Other Ambulatory Visit (HOSPITAL_BASED_OUTPATIENT_CLINIC_OR_DEPARTMENT_OTHER): Payer: Self-pay

## 2021-11-14 ENCOUNTER — Other Ambulatory Visit (HOSPITAL_BASED_OUTPATIENT_CLINIC_OR_DEPARTMENT_OTHER): Payer: Self-pay

## 2021-12-14 ENCOUNTER — Other Ambulatory Visit (HOSPITAL_BASED_OUTPATIENT_CLINIC_OR_DEPARTMENT_OTHER): Payer: Self-pay

## 2021-12-19 DIAGNOSIS — H5213 Myopia, bilateral: Secondary | ICD-10-CM | POA: Diagnosis not present

## 2022-01-14 ENCOUNTER — Other Ambulatory Visit: Payer: Self-pay | Admitting: Family Medicine

## 2022-01-15 ENCOUNTER — Other Ambulatory Visit (HOSPITAL_BASED_OUTPATIENT_CLINIC_OR_DEPARTMENT_OTHER): Payer: Self-pay

## 2022-01-15 MED ORDER — ESCITALOPRAM OXALATE 20 MG PO TABS
20.0000 mg | ORAL_TABLET | Freq: Every day | ORAL | 3 refills | Status: DC
Start: 2022-01-15 — End: 2022-05-19
  Filled 2022-01-15: qty 30, 30d supply, fill #0
  Filled 2022-02-09: qty 30, 30d supply, fill #1
  Filled 2022-03-13: qty 30, 30d supply, fill #2
  Filled 2022-04-20: qty 30, 30d supply, fill #3

## 2022-02-01 ENCOUNTER — Other Ambulatory Visit (HOSPITAL_COMMUNITY)
Admission: RE | Admit: 2022-02-01 | Discharge: 2022-02-01 | Disposition: A | Discharge status: Home / Self Care | Payer: 59 | Source: Ambulatory Visit | Attending: Obstetrics & Gynecology | Admitting: Obstetrics & Gynecology

## 2022-02-01 ENCOUNTER — Ambulatory Visit (HOSPITAL_BASED_OUTPATIENT_CLINIC_OR_DEPARTMENT_OTHER): Payer: 59 | Admitting: Obstetrics & Gynecology

## 2022-02-01 ENCOUNTER — Encounter (HOSPITAL_BASED_OUTPATIENT_CLINIC_OR_DEPARTMENT_OTHER): Payer: Self-pay | Admitting: Obstetrics & Gynecology

## 2022-02-01 VITALS — BP 126/80 | HR 79 | Ht 63.0 in | Wt 143.8 lb

## 2022-02-01 DIAGNOSIS — Z124 Encounter for screening for malignant neoplasm of cervix: Secondary | ICD-10-CM | POA: Insufficient documentation

## 2022-02-01 DIAGNOSIS — Z01419 Encounter for gynecological examination (general) (routine) without abnormal findings: Secondary | ICD-10-CM | POA: Diagnosis not present

## 2022-02-01 NOTE — Progress Notes (Signed)
26 y.o. G0 Married White or Caucasian female here for annual exam/new patient.  Cycles are regular.  Flow lasts about 4 days.  Using condoms for contraception and happy with this.  No current pregnancy plans.    LMP:  end of July       Sexually active: Yes.    The current method of family planning is condoms always.    Exercising: Yes.     Weights and stretching Smoker:  no  Health Maintenance: Pap:  07/29/2018 Negative History of abnormal Pap:  no MMG:  guidelines reviewed Colonoscopy:  guidelines reviewed Screening Labs: not drawn today   reports that she has never smoked. She has never used smokeless tobacco. She reports current alcohol use. She reports that she does not use drugs.  No past medical history on file.  Past Surgical History:  Procedure Laterality Date   MANDIBLE FRACTURE SURGERY      Current Outpatient Medications  Medication Sig Dispense Refill   escitalopram (LEXAPRO) 20 MG tablet Take 1 tablet (20 mg total) by mouth daily. 30 tablet 3   ibuprofen (ADVIL,MOTRIN) 200 MG tablet Take 400 mg by mouth every 6 (six) hours as needed for headache, mild pain or moderate pain.     OVER THE COUNTER MEDICATION Pamprin--menstrual pain relief     No current facility-administered medications for this visit.    Family History  Problem Relation Age of Onset   Asthma Brother    Heart disease Paternal Grandfather    Cancer Paternal Grandfather        lung   Glaucoma Paternal Grandfather    Healthy Mother    Healthy Father    Hearing loss Maternal Grandmother     ROS: Constitutional: negative Genitourinary:negative  Exam:   BP 126/80 (BP Location: Right Arm, Patient Position: Sitting, Cuff Size: Normal)   Pulse 79   Ht 5\' 3"  (1.6 m) Comment: Reported  Wt 143 lb 12.8 oz (65.2 kg)   LMP 01/07/2022 (Approximate)   BMI 25.47 kg/m   Height: 5\' 3"  (160 cm) (Reported)  General appearance: alert, cooperative and appears stated age Head: Normocephalic, without obvious  abnormality, atraumatic Neck: no adenopathy, supple, symmetrical, trachea midline and thyroid normal to inspection and palpation Lungs: clear to auscultation bilaterally Breasts: normal appearance, no masses or tenderness Heart: regular rate and rhythm Abdomen: soft, non-tender; bowel sounds normal; no masses,  no organomegaly Extremities: extremities normal, atraumatic, no cyanosis or edema Skin: Skin color, texture, turgor normal. No rashes or lesions Lymph nodes: Cervical, supraclavicular, and axillary nodes normal. No abnormal inguinal nodes palpated Neurologic: Grossly normal   Pelvic: External genitalia:  no lesions              Urethra:  normal appearing urethra with no masses, tenderness or lesions              Bartholins and Skenes: normal                 Vagina: normal appearing vagina with normal color and no discharge, no lesions              Cervix: no lesions              Pap taken: No. Bimanual Exam:  Uterus:  normal size, contour, position, consistency, mobility, non-tender              Adnexa: normal adnexa and no mass, fullness, tenderness  Rectovaginal: Confirms               Anus:  normal sphincter tone, no lesions  Chaperone, Ina Homes, CMA, was present for exam.  Assessment/Plan: 1. Well woman exam with routine gynecological exam - Pap smear obtained today - Mammogram guidelines reviewed - Colonoscopy guidelines reviewed - vaccines reviewed/updated  2. Cervical cancer screening - Cytology - PAP( [place])

## 2022-02-05 LAB — CYTOLOGY - PAP: Diagnosis: NEGATIVE

## 2022-02-09 ENCOUNTER — Other Ambulatory Visit (HOSPITAL_COMMUNITY): Payer: Self-pay

## 2022-02-13 ENCOUNTER — Other Ambulatory Visit (HOSPITAL_COMMUNITY): Payer: Self-pay

## 2022-03-14 ENCOUNTER — Other Ambulatory Visit (HOSPITAL_COMMUNITY): Payer: Self-pay

## 2022-04-22 ENCOUNTER — Other Ambulatory Visit (HOSPITAL_COMMUNITY): Payer: Self-pay

## 2022-04-24 ENCOUNTER — Other Ambulatory Visit (HOSPITAL_COMMUNITY): Payer: Self-pay

## 2022-04-24 DIAGNOSIS — H00014 Hordeolum externum left upper eyelid: Secondary | ICD-10-CM | POA: Diagnosis not present

## 2022-04-24 DIAGNOSIS — H01004 Unspecified blepharitis left upper eyelid: Secondary | ICD-10-CM | POA: Diagnosis not present

## 2022-04-24 MED ORDER — AZITHROMYCIN 250 MG PO TABS
ORAL_TABLET | ORAL | 0 refills | Status: AC
  Filled 2022-04-24: qty 6, 5d supply, fill #0

## 2022-05-19 ENCOUNTER — Other Ambulatory Visit: Payer: Self-pay | Admitting: Family Medicine

## 2022-05-20 ENCOUNTER — Other Ambulatory Visit (HOSPITAL_COMMUNITY): Payer: Self-pay

## 2022-05-20 MED ORDER — ESCITALOPRAM OXALATE 20 MG PO TABS
20.0000 mg | ORAL_TABLET | Freq: Every day | ORAL | 3 refills | Status: DC
  Filled 2022-05-20: qty 30, 30d supply, fill #0
  Filled 2022-06-18: qty 30, 30d supply, fill #1
  Filled 2022-07-21: qty 30, 30d supply, fill #2
  Filled 2022-08-27: qty 30, 30d supply, fill #3

## 2022-05-21 ENCOUNTER — Other Ambulatory Visit (HOSPITAL_COMMUNITY): Payer: Self-pay

## 2022-06-19 ENCOUNTER — Other Ambulatory Visit (HOSPITAL_COMMUNITY): Payer: Self-pay

## 2022-07-01 ENCOUNTER — Encounter: Payer: Self-pay | Admitting: Family Medicine

## 2022-07-02 ENCOUNTER — Telehealth (INDEPENDENT_AMBULATORY_CARE_PROVIDER_SITE_OTHER): Payer: BC Managed Care – PPO | Admitting: Family

## 2022-07-02 DIAGNOSIS — U071 COVID-19: Secondary | ICD-10-CM

## 2022-07-02 DIAGNOSIS — R0989 Other specified symptoms and signs involving the circulatory and respiratory systems: Secondary | ICD-10-CM

## 2022-07-02 MED ORDER — PROMETHAZINE-DM 6.25-15 MG/5ML PO SYRP: 5.0000 mL | ORAL_SOLUTION | Freq: Four times a day (QID) | ORAL | 0 refills | Status: DC | PRN

## 2022-07-02 MED ORDER — NIRMATRELVIR/RITONAVIR (PAXLOVID)TABLET: 3.0000 | ORAL_TABLET | Freq: Two times a day (BID) | ORAL | 0 refills | Status: AC

## 2022-07-02 NOTE — Telephone Encounter (Signed)
Pt is aware and her husband is schedule for a virtual with DR Carlota Raspberry tomorrow morning

## 2022-07-03 NOTE — Progress Notes (Signed)
   Virtual Visit via Video   I connected with patient on 07/03/22 at 11:00 AM EST by a video enabled telemedicine application and verified that I am speaking with the correct person using two identifiers.  Location patient: Home Location provider: [facility] Persons participating in the virtual visit: Patient, Provider, CMA  I discussed the limitations of evaluation and management by telemedicine and the availability of in person appointments. The patient expressed understanding and agreed to proceed.  Subjective:   HPI:   27 year old female being seen virtually after testing positive for COVID-19 at home by rapid antigen test.She has cough, nasal congestion. She would like to consider Paxlovid.   ROS:   See pertinent positives and negatives per HPI.  Patient Active Problem List   Diagnosis Date Noted   Depression 07/14/2020   Physical exam 07/14/2020   Anxiety 03/09/2020    Social History   Tobacco Use   Smoking status: Never   Smokeless tobacco: Never  Substance Use Topics   Alcohol use: Yes    Comment: social    Current Outpatient Medications:    nirmatrelvir/ritonavir (PAXLOVID) 20 x 150 MG & 10 x 100MG  TABS, Take 3 tablets by mouth 2 (two) times daily for 5 days. (Take nirmatrelvir 150 mg two tablets twice daily for 5 days and ritonavir 100 mg one tablet twice daily for 5 days) Patient GFR is >60, Disp: 30 tablet, Rfl: 0   promethazine-dextromethorphan (PROMETHAZINE-DM) 6.25-15 MG/5ML syrup, Take 5 mLs by mouth 4 (four) times daily as needed., Disp: 118 mL, Rfl: 0   escitalopram (LEXAPRO) 20 MG tablet, Take 1 tablet (20 mg total) by mouth daily., Disp: 30 tablet, Rfl: 3   ibuprofen (ADVIL,MOTRIN) 200 MG tablet, Take 400 mg by mouth every 6 (six) hours as needed for headache, mild pain or moderate pain., Disp: , Rfl:    OVER THE COUNTER MEDICATION, Pamprin--menstrual pain relief, Disp: , Rfl:   Allergies  Allergen Reactions   Adhesive [Tape] Rash    Amoxicillin Rash    *childhood allergy* pt does not recall any specific details about reaction     Objective:   There were no vitals taken for this visit.  Patient is well-developed, well-nourished in no acute distress.  Resting comfortably  at home.  Head is normocephalic, atraumatic.  No labored breathing.  Speech is clear and coherent with logical content.  Patient is alert and oriented at baseline.    Assessment and Plan:   Diagnoses and all orders for this visit:  COVID-19  Runny nose  Other orders -     nirmatrelvir/ritonavir (PAXLOVID) 20 x 150 MG & 10 x 100MG  TABS; Take 3 tablets by mouth 2 (two) times daily for 5 days. (Take nirmatrelvir 150 mg two tablets twice daily for 5 days and ritonavir 100 mg one tablet twice daily for 5 days) Patient GFR is >60 -     promethazine-dextromethorphan (PROMETHAZINE-DM) 6.25-15 MG/5ML syrup; Take 5 mLs by mouth 4 (four) times daily as needed.   Call the office if symptoms worsen or persist. Recheck as scheduled and sooner as needed.    Kennyth Arnold, FNP 07/03/2022  Time spent with the patient: 20 minutes, of which >50% was spent in obtaining information about symptoms, reviewing previous labs, evaluations, and treatments, counseling about condition (please see the discussed topics above), and developing a plan to further investigate it; had a number of questions which I addressed.

## 2022-07-22 ENCOUNTER — Other Ambulatory Visit (HOSPITAL_COMMUNITY): Payer: Self-pay

## 2022-08-14 IMAGING — US US PELVIS COMPLETE TRANSABD/TRANSVAG W DUPLEX AND/OR DOPPLER
1 series · 13 of 25 positions shown · non-contrast
Comparison: None.

CLINICAL DATA: Pelvic pain



[Series 1: us pelvis complete transabd/transvag w duplex and/ · 13 of 109 slices shown]
[im 1/109]
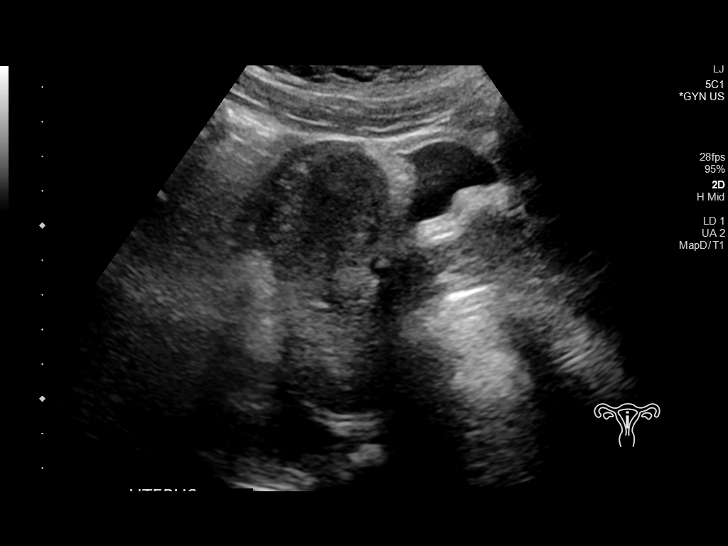
[im 10/109]
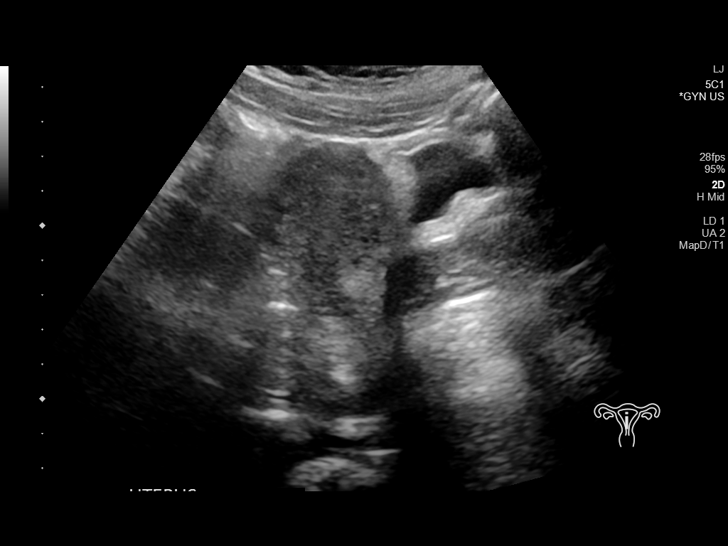
[im 19/109]
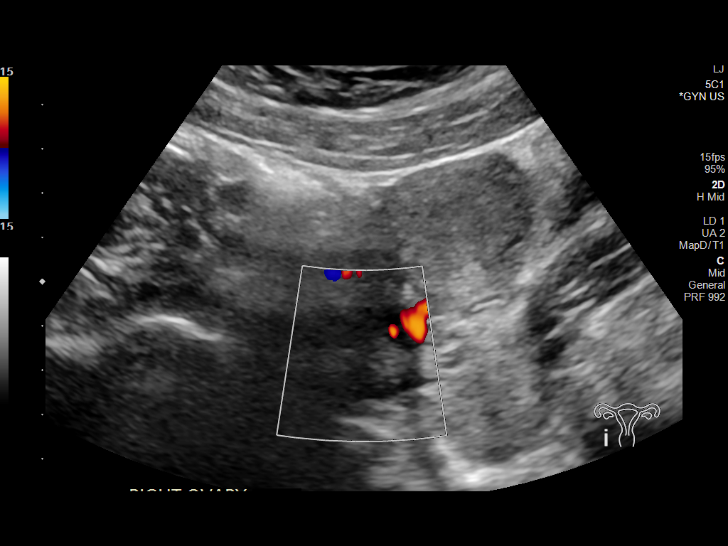
[im 28/109]
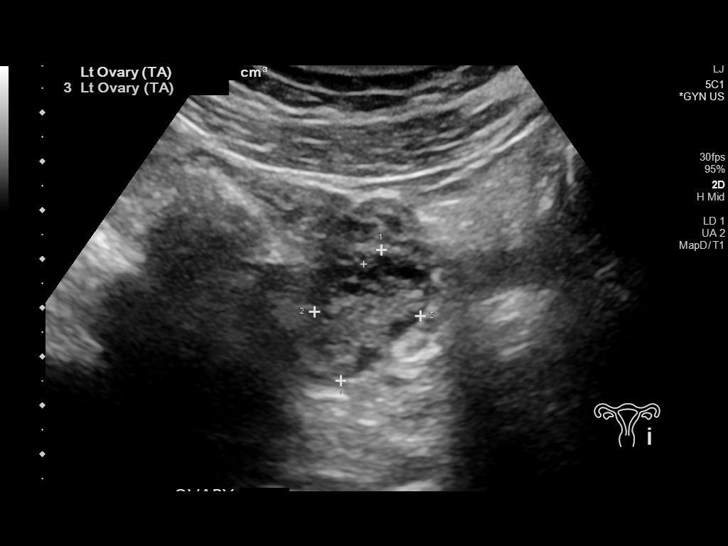
[im 37/109]
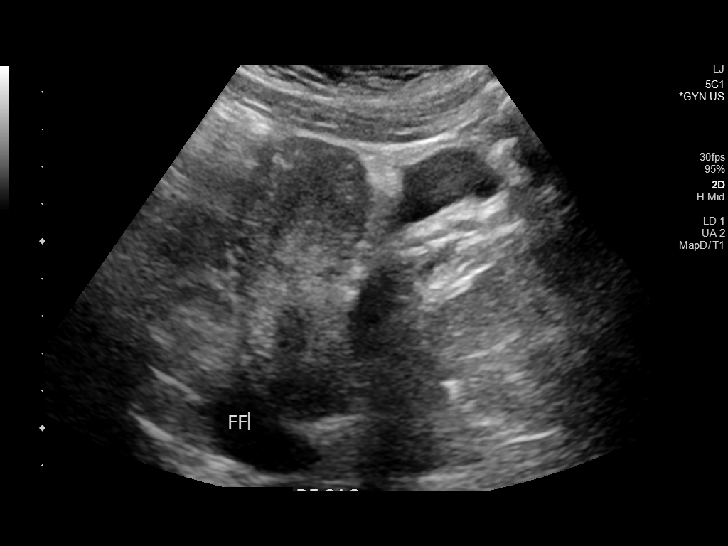
[im 46/109]
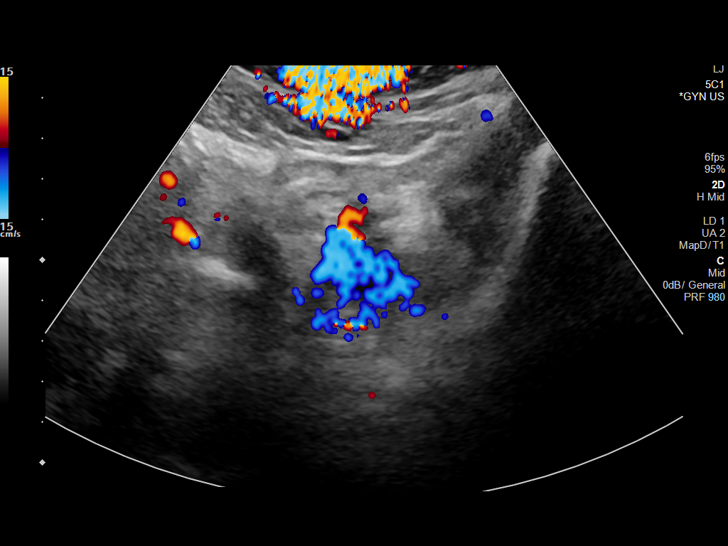
[im 55/109]
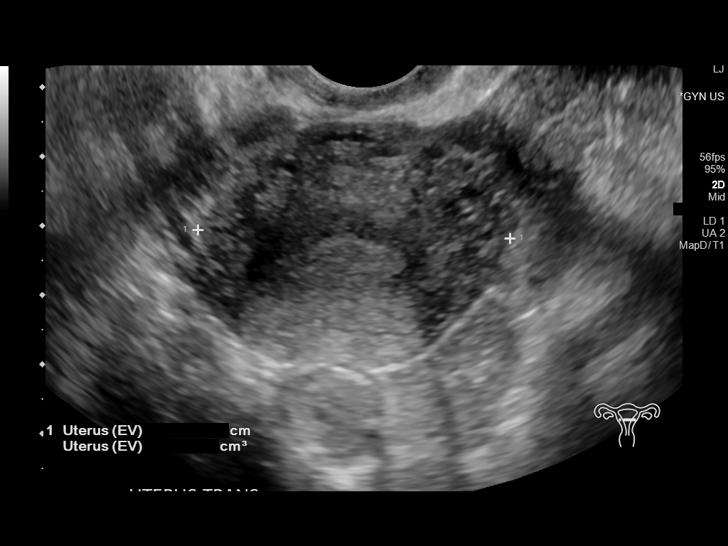
[im 64/109]
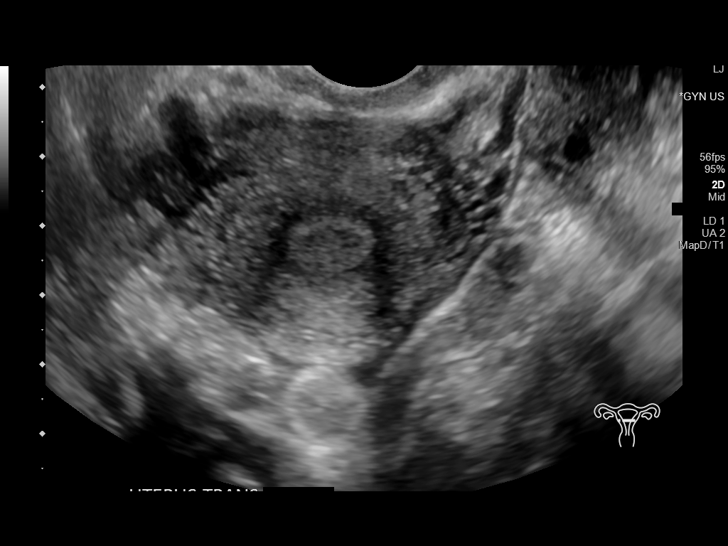
[im 73/109]
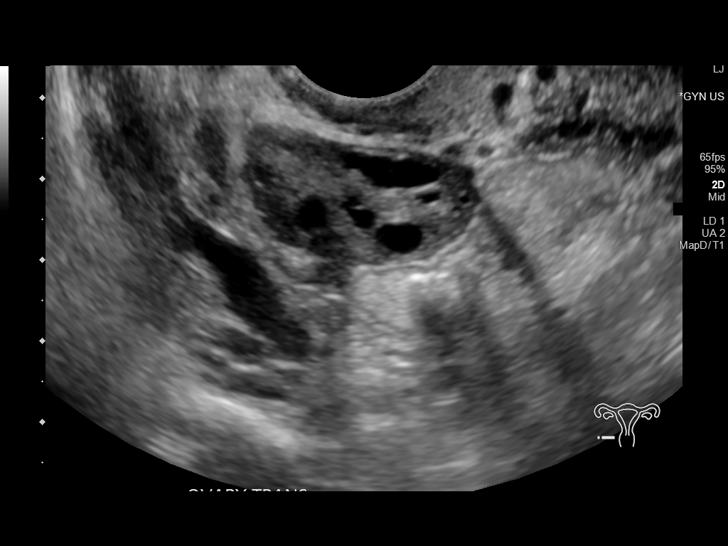
[im 82/109]
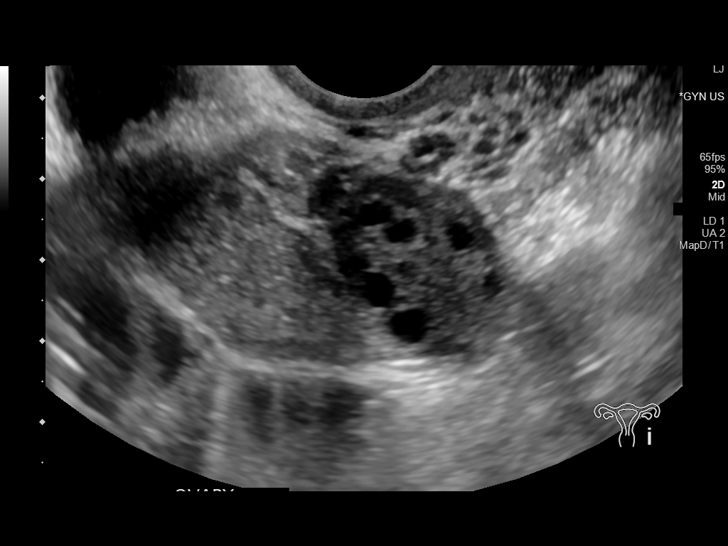
[im 91/109]
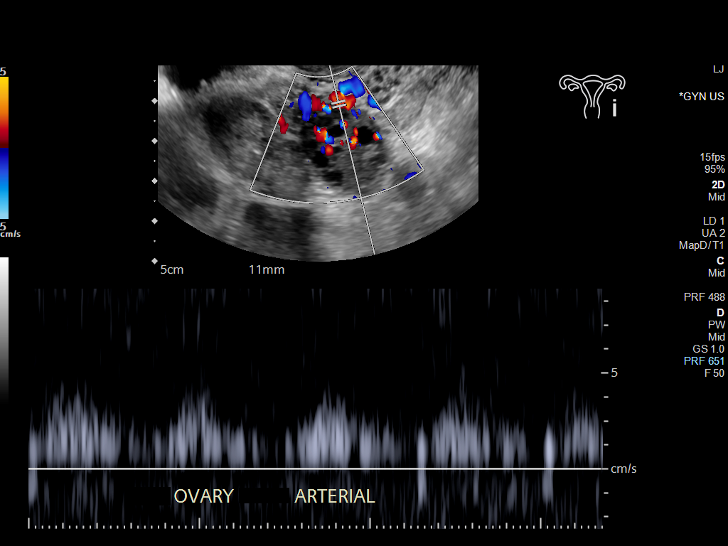
[im 100/109]
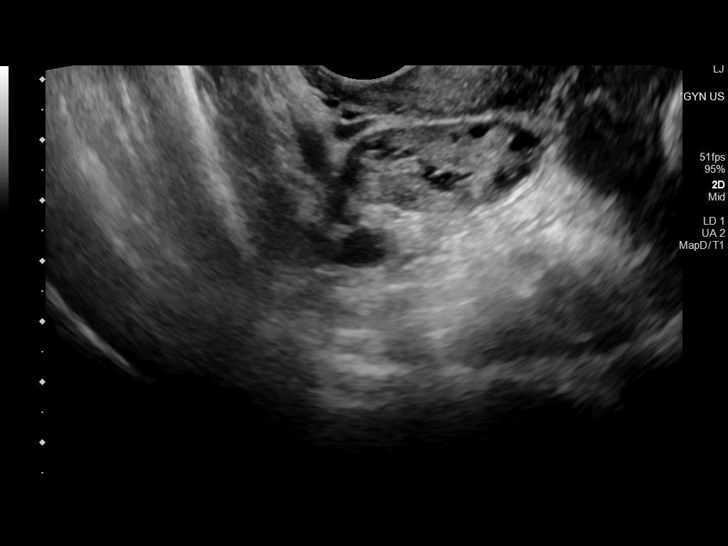
[im 109/109]
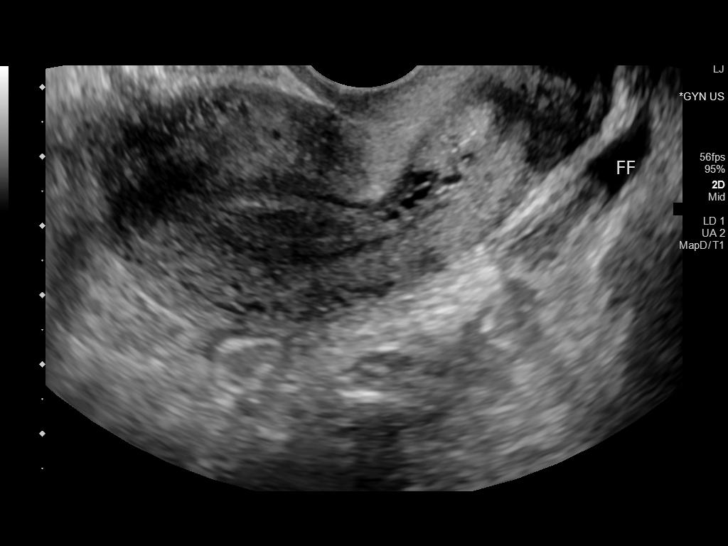

[13 of 25 positions shown; findings below may reference images not displayed]

FINDINGS: Uterus

Measurements: 8.0 x 3.6 x 4.4 cm = volume: 63 mL. No fibroids or
other mass visualized.

Endometrium

Thickness: 8.0.  Normal thickness for premenopausal female

Right ovary

Measurements: 2.9 x 1.5 x 2.8 cm = volume: 6.6 mL. Normal small
follicles

Left ovary

Measurements: 2.5 x 2.0 x 2.3 cm = volume: 5.8 mL. Normal small
follicles.

Pulsed Doppler evaluation of both ovaries demonstrates normal
low-resistance arterial and venous waveforms.

Other findings

No abnormal free fluid.
IMPRESSION: Normal uterus and ovaries.

## 2022-08-28 DIAGNOSIS — F4320 Adjustment disorder, unspecified: Secondary | ICD-10-CM | POA: Diagnosis not present

## 2022-09-02 ENCOUNTER — Ambulatory Visit (INDEPENDENT_AMBULATORY_CARE_PROVIDER_SITE_OTHER): Payer: BC Managed Care – PPO | Admitting: Family Medicine

## 2022-09-02 ENCOUNTER — Encounter: Payer: Self-pay | Admitting: Family Medicine

## 2022-09-02 VITALS — BP 112/80 | HR 75 | Temp 98.3°F | Resp 17 | Ht 63.0 in | Wt 149.0 lb

## 2022-09-02 DIAGNOSIS — Z0000 Encounter for general adult medical examination without abnormal findings: Secondary | ICD-10-CM

## 2022-09-02 DIAGNOSIS — E663 Overweight: Secondary | ICD-10-CM | POA: Diagnosis not present

## 2022-09-02 DIAGNOSIS — M545 Low back pain, unspecified: Secondary | ICD-10-CM

## 2022-09-02 LAB — HEPATIC FUNCTION PANEL
ALT: 16 U/L (ref 0–35)
AST: 15 U/L (ref 0–37)
Albumin: 4.4 g/dL (ref 3.5–5.2)
Alkaline Phosphatase: 36 U/L — ABNORMAL LOW (ref 39–117)
Bilirubin, Direct: 0.1 mg/dL (ref 0.0–0.3)
Total Bilirubin: 0.6 mg/dL (ref 0.2–1.2)
Total Protein: 6.8 g/dL (ref 6.0–8.3)

## 2022-09-02 LAB — CBC WITH DIFFERENTIAL/PLATELET
Basophils Absolute: 0 10*3/uL (ref 0.0–0.1)
Basophils Relative: 0.3 % (ref 0.0–3.0)
Eosinophils Absolute: 0.1 10*3/uL (ref 0.0–0.7)
Eosinophils Relative: 1.7 % (ref 0.0–5.0)
HCT: 40.4 % (ref 36.0–46.0)
Hemoglobin: 14.1 g/dL (ref 12.0–15.0)
Lymphocytes Relative: 34.3 % (ref 12.0–46.0)
Lymphs Abs: 2 10*3/uL (ref 0.7–4.0)
MCHC: 34.9 g/dL (ref 30.0–36.0)
MCV: 89.2 fl (ref 78.0–100.0)
Monocytes Absolute: 0.5 10*3/uL (ref 0.1–1.0)
Monocytes Relative: 8.6 % (ref 3.0–12.0)
Neutro Abs: 3.2 10*3/uL (ref 1.4–7.7)
Neutrophils Relative %: 55.1 % (ref 43.0–77.0)
Platelets: 294 10*3/uL (ref 150.0–400.0)
RBC: 4.53 Mil/uL (ref 3.87–5.11)
RDW: 12.7 % (ref 11.5–15.5)
WBC: 5.8 10*3/uL (ref 4.0–10.5)

## 2022-09-02 LAB — BASIC METABOLIC PANEL
BUN: 14 mg/dL (ref 6–23)
CO2: 26 mEq/L (ref 19–32)
Calcium: 9.6 mg/dL (ref 8.4–10.5)
Chloride: 103 mEq/L (ref 96–112)
Creatinine, Ser: 0.74 mg/dL (ref 0.40–1.20)
GFR: 111.97 mL/min (ref >60.00)
Glucose, Bld: 51 mg/dL — ABNORMAL LOW (ref 70–99)
Potassium: 3.8 mEq/L (ref 3.5–5.1)
Sodium: 139 mEq/L (ref 135–145)

## 2022-09-02 LAB — TSH: TSH: 4.01 u[IU]/mL (ref 0.35–5.50)

## 2022-09-02 LAB — LIPID PANEL
Cholesterol: 189 mg/dL (ref 0–200)
HDL: 54.6 mg/dL (ref >39.00)
LDL Cholesterol: 106 mg/dL — ABNORMAL HIGH (ref 0–99)
NonHDL: 134.51
Total CHOL/HDL Ratio: 3
Triglycerides: 144 mg/dL (ref 0.0–149.0)
VLDL: 28.8 mg/dL (ref 0.0–40.0)

## 2022-09-02 NOTE — Assessment & Plan Note (Signed)
Pt's PE WNL.  UTD on pap, Tdap.  Check labs.  Anticipatory guidance provided.  

## 2022-09-02 NOTE — Progress Notes (Signed)
   Subjective:    Patient ID: Jennifer Haley, female    DOB: March 14, 1996, 27 y.o.   MRN: [id number]  HPI CPE- UTD on pap, Tdap  Patient Care Team    Relationship Specialty Notifications Start End  Midge Minium, MD PCP - General Family Medicine  01/06/20      Health Maintenance  Topic Date Due   PAP-Cervical Cytology Screening  02/01/2025   PAP SMEAR-Modifier  02/01/2025   DTaP/Tdap/Td (9 - Td or Tdap) 01/11/2029   INFLUENZA VACCINE  Completed   HPV VACCINES  Completed   COVID-19 Vaccine  Discontinued   Hepatitis C Screening  Discontinued   HIV Screening  Discontinued      Review of Systems Patient reports no vision/ hearing changes, adenopathy,fever, weight change,  persistant/recurrent hoarseness , swallowing issues, chest pain, palpitations, edema, persistant/recurrent cough, hemoptysis, dyspnea (rest/exertional/paroxysmal nocturnal), gastrointestinal bleeding (melena, rectal bleeding), abdominal pain, significant heartburn, bowel changes, GU symptoms (dysuria, hematuria, incontinence), Gyn symptoms (abnormal  bleeding, pain),  syncope, focal weakness, memory loss, numbness & tingling, skin/hair/nail changes, abnormal bruising or bleeding, anxiety, or depression.   'i keep tweaking my back'- lower back, will tighten up periodically and she has a hard time straightening up.    Objective:   Physical Exam General Appearance:    Alert, cooperative, no distress, appears stated age  Head:    Normocephalic, without obvious abnormality, atraumatic  Eyes:    PERRL, conjunctiva/corneas clear, EOM's intact both eyes  Ears:    Normal TM's and external ear canals, both ears  Nose:   Nares normal, septum midline, mucosa normal, no drainage    or sinus tenderness  Throat:   Lips, mucosa, and tongue normal; teeth and gums normal  Neck:   Supple, symmetrical, trachea midline, no adenopathy;    Thyroid: no enlargement/tenderness/nodules  Back:     Symmetric, no curvature, ROM normal, no  CVA tenderness  Lungs:     Clear to auscultation bilaterally, respirations unlabored  Chest Wall:    No tenderness or deformity   Heart:    Regular rate and rhythm, S1 and S2 normal, no murmur, rub   or gallop  Breast Exam:    Deferred to GYN  Abdomen:     Soft, non-tender, bowel sounds active all four quadrants,    no masses, no organomegaly  Genitalia:    Deferred to GYN  Rectal:    Extremities:   Extremities normal, atraumatic, no cyanosis or edema  Pulses:   2+ and symmetric all extremities  Skin:   Skin color, texture, turgor normal, no rashes or lesions  Lymph nodes:   Cervical, supraclavicular, and axillary nodes normal  Neurologic:   CNII-XII intact, normal strength, sensation and reflexes    throughout          Assessment & Plan:

## 2022-09-02 NOTE — Patient Instructions (Signed)
Follow up in 1 year or as needed ?We'll notify you of your lab results and make any changes if needed ?Keep up the good work on healthy diet and regular exercise- you look great!!! ?Call with any questions or concerns ?Stay Safe!  Stay Healthy! ?Happy Belated Birthday!!! ?

## 2022-09-03 ENCOUNTER — Telehealth: Payer: Self-pay

## 2022-09-03 ENCOUNTER — Encounter: Payer: Self-pay | Admitting: Family Medicine

## 2022-09-03 NOTE — Telephone Encounter (Signed)
-----   Message from Midge Minium, MD sent at 09/03/2022  7:21 AM EDT ----- Labs look great!  No changes at this time

## 2022-09-03 NOTE — Telephone Encounter (Signed)
Pt seen results Via my chart  

## 2022-09-11 DIAGNOSIS — F419 Anxiety disorder, unspecified: Secondary | ICD-10-CM | POA: Diagnosis not present

## 2022-09-11 DIAGNOSIS — F32A Depression, unspecified: Secondary | ICD-10-CM | POA: Diagnosis not present

## 2022-09-17 DIAGNOSIS — F419 Anxiety disorder, unspecified: Secondary | ICD-10-CM | POA: Diagnosis not present

## 2022-09-17 DIAGNOSIS — F32A Depression, unspecified: Secondary | ICD-10-CM | POA: Diagnosis not present

## 2022-09-23 ENCOUNTER — Telehealth: Payer: BC Managed Care – PPO | Admitting: Physician Assistant

## 2022-09-23 DIAGNOSIS — K529 Noninfective gastroenteritis and colitis, unspecified: Secondary | ICD-10-CM

## 2022-09-23 MED ORDER — ONDANSETRON 4 MG PO TBDP: 4.0000 mg | ORAL_TABLET | Freq: Three times a day (TID) | ORAL | 0 refills | Status: DC | PRN

## 2022-09-23 NOTE — Patient Instructions (Signed)
Jennifer Haley, thank you for joining Piedad Climes, PA-C for today's virtual visit.  While this provider is not your primary care provider (PCP), if your PCP is located in our provider database this encounter information will be shared with them immediately following your visit.   A [facility] MyChart account gives you access to today's visit and all your visits, tests, and labs performed at [facility] " click here if you don't have a [facility] MyChart account or go to [email or web address]  Consent: (Patient) Jennifer Haley provided verbal consent for this virtual visit at the beginning of the encounter.  Current Medications:  Current Outpatient Medications:    escitalopram (LEXAPRO) 20 MG tablet, Take 1 tablet (20 mg total) by mouth daily., Disp: 30 tablet, Rfl: 3   ibuprofen (ADVIL,MOTRIN) 200 MG tablet, Take 400 mg by mouth every 6 (six) hours as needed for headache, mild pain or moderate pain., Disp: , Rfl:    Medications ordered in this encounter:  No orders of the defined types were placed in this encounter.    *If you need refills on other medications prior to your next appointment, please contact your pharmacy*  Follow-Up: Call back or seek an in-person evaluation if the symptoms worsen or if the condition fails to improve as anticipated.  [facility] [phone]  Other Instructions Please keep well-hydrated and get plenty of rest. Start a daily probiotic. Okay to use Imodium over-the-counter as directed. The Zofran is to pick up from the pharmacy if needed for any recurrence of nausea. Follow the dietary recommendations below. If symptoms or not continuing to improve and resolve over the next few days, please seek an in person evaluation.  Food Choices to Help Relieve Diarrhea, Adult Diarrhea can make you feel weak and cause you to become dehydrated. Dehydration is a condition in which there is not enough water or other fluids in  the body. It is important to choose the right foods and drinks to: Relieve diarrhea. Replace lost fluids and nutrients. Prevent dehydration. What are tips for following this plan? Relieving diarrhea Avoid foods that make your diarrhea worse. These may include: Foods and drinks that are sweetened with high-fructose corn syrup, honey, or sweeteners such as xylitol, sorbitol, and mannitol. Check food labels for these ingredients. Fried, greasy, or spicy foods. Raw fruits and vegetables. Eat foods that are rich in probiotics. These include foods such as yogurt and fermented milk products. Probiotics can help increase healthy bacteria in your stomach and intestines (gastrointestinal or GI tract). This may help digestion and stop diarrhea. If you have lactose intolerance, avoid dairy products. These may make your diarrhea worse. Take medicine to help stop diarrhea only as told by your health care provider. Replacing nutrients  Eat bland, easy-to-digest foods in small amounts as you are able, until your diarrhea starts to get better. These foods include bananas, applesauce, rice, toast, and crackers. Over time, add nutrient-rich foods as your body tolerates them or as told by your health care provider. These include: Well-cooked protein foods, such as eggs, lean meats like fish or chicken without skin, and tofu. Peeled, seeded, and soft-cooked fruits and vegetables. Low-fat dairy products. Whole grains. Take vitamin and mineral supplements as told by your health care provider. Preventing dehydration  Start by sipping water or a solution to prevent dehydration (oral rehydration solution, or ORS). This is a drink that helps replace fluids and minerals your body has lost. You can buy an ORS at pharmacies and  retail stores. Try to drink at least 8-10 cups (2,000-2,500 mL) of fluid each day to help replace lost fluids. If your urine is pale yellow, you are getting enough fluids. You may drink other  liquids in addition to water, such as fruit juice that you have added water to (diluted fruit juice) or low-calorie sports drinks, as tolerated or as told by your health care provider. Avoid drinks with caffeine, such as coffee, tea, or soft drinks. Avoid alcohol. This information is not intended to replace advice given to you by your health care provider. Make sure you discuss any questions you have with your health care provider. Document Revised: 11/20/2021 Document Reviewed: 11/20/2021 Elsevier Patient Education  2023 Elsevier Inc.    If you have been instructed to have an in-person evaluation today at a local Urgent Care facility, please use the link below. It will take you to a list of all of our available [facility], including address, phone number and hours of operation. Please do not delay care.  [facility]  If you or a family member do not have a primary care provider, use the link below to schedule a visit and establish care. When you choose a [facility] primary care physician or advanced practice provider, you gain a long-term partner in health. Find a Primary Care Provider  Learn more about [facility] in-office and virtual care options: [facility] - Get Care Now

## 2022-09-23 NOTE — Progress Notes (Signed)
Virtual Visit Consent   Jennifer Haley, you are scheduled for a virtual visit with a [facility] provider today. Just as with appointments in the office, your consent must be obtained to participate. Your consent will be active for this visit and any virtual visit you may have with one of our providers in the next 365 days. If you have a MyChart account, a copy of this consent can be sent to you electronically.  As this is a virtual visit, video technology does not allow for your provider to perform a traditional examination. This may limit your provider's ability to fully assess your condition. If your provider identifies any concerns that need to be evaluated in person or the need to arrange testing (such as labs, EKG, etc.), we will make arrangements to do so. Although advances in technology are sophisticated, we cannot ensure that it will always work on either your end or our end. If the connection with a video visit is poor, the visit may have to be switched to a telephone visit. With either a video or telephone visit, we are not always able to ensure that we have a secure connection.  By engaging in this virtual visit, you consent to the provision of healthcare and authorize for your insurance to be billed (if applicable) for the services provided during this visit. Depending on your insurance coverage, you may receive a charge related to this service.  I need to obtain your verbal consent now. Are you willing to proceed with your visit today? Jennifer Haley has provided verbal consent on 09/23/2022 for a virtual visit (video or telephone). Piedad Climes, [place]  Date: 09/23/2022 12:39 PM  Virtual Visit via Video Note   I, Piedad Climes, connected with  Jennifer Haley  ([id number], 1995/07/06) on 09/23/22 at 12:30 PM EDT by a video-enabled telemedicine application and verified that I am speaking with the correct person using two identifiers.  Location: Patient: Virtual Visit Location  Patient: Home Provider: Virtual Visit Location Provider: Home Office   I discussed the limitations of evaluation and management by telemedicine and the availability of in person appointments. The patient expressed understanding and agreed to proceed.    History of Present Illness: Jennifer Haley is a 27 y.o. who identifies as a female who was assigned female at birth, and is being seen today for GI symptoms starting Saturday evening. Noted earlier in the day she had a slice of pizza and a salad (is lactose intolerant). 7pm noted abdominal ramping followed by loose stool, nausea and an episode of vomiting. Sunday she felt nauseas with some loose stools and anorexia. Stayed hydrated. Started saltines. This morning had a pear and almonds with a small shot of espresso. Denies recent travel. Denies fever, chills. Denies melena, hematochezia or tenesmus.    HPI: HPI  Problems:  Patient Active Problem List   Diagnosis Date Noted   Depression 07/14/2020   Physical exam 07/14/2020   Anxiety 03/09/2020    Allergies:  Allergies  Allergen Reactions   Adhesive [Tape] Rash   Amoxicillin Rash    *childhood allergy* pt does not recall any specific details about reaction    Medications:  Current Outpatient Medications:    ondansetron (ZOFRAN-ODT) 4 MG disintegrating tablet, Take 1 tablet (4 mg total) by mouth every 8 (eight) hours as needed for nausea or vomiting., Disp: 20 tablet, Rfl: 0   escitalopram (LEXAPRO) 20 MG tablet, Take 1 tablet (20 mg total) by mouth daily., Disp: 30 tablet, Rfl: 3  ibuprofen (ADVIL,MOTRIN) 200 MG tablet, Take 400 mg by mouth every 6 (six) hours as needed for headache, mild pain or moderate pain., Disp: , Rfl:   Observations/Objective: Patient is well-developed, well-nourished in no acute distress.  Resting comfortably at home.  Head is normocephalic, atraumatic.  No labored breathing. Speech is clear and coherent with logical content.  Patient is alert and oriented at  baseline.   Assessment and Plan: 1. Gastroenteritis  Most likely mild viral gastroenteritis, worsened by recent dairy consumption given her history of lactose intolerance.  Supportive measures and OTC medications reviewed.  She is going to start Imodium today.  Brat diet reviewed.  Zofran per orders.  Follow-up in person if symptoms or not continuing to improve/resolve over the next couple of days.  ER precautions reviewed with patient.  Follow Up Instructions: I discussed the assessment and treatment plan with the patient. The patient was provided an opportunity to ask questions and all were answered. The patient agreed with the plan and demonstrated an understanding of the instructions.  A copy of instructions were sent to the patient via MyChart unless otherwise noted below.   The patient was advised to call back or seek an in-person evaluation if the symptoms worsen or if the condition fails to improve as anticipated.  Time:  I spent 10 minutes with the patient via telehealth technology discussing the above problems/concerns.    Piedad Climes, PA-C

## 2022-09-28 ENCOUNTER — Other Ambulatory Visit: Payer: Self-pay | Admitting: Family Medicine

## 2022-09-30 ENCOUNTER — Other Ambulatory Visit (HOSPITAL_COMMUNITY): Payer: Self-pay

## 2022-09-30 MED ORDER — ESCITALOPRAM OXALATE 20 MG PO TABS
20.0000 mg | ORAL_TABLET | Freq: Every day | ORAL | 3 refills | Status: DC
  Filled 2022-09-30: qty 30, 30d supply, fill #0
  Filled 2022-10-28: qty 30, 30d supply, fill #1
  Filled 2022-11-25: qty 30, 30d supply, fill #2
  Filled 2022-12-25: qty 30, 30d supply, fill #3

## 2022-10-01 ENCOUNTER — Other Ambulatory Visit (HOSPITAL_COMMUNITY): Payer: Self-pay

## 2022-10-01 DIAGNOSIS — F419 Anxiety disorder, unspecified: Secondary | ICD-10-CM | POA: Diagnosis not present

## 2022-10-01 DIAGNOSIS — F32A Depression, unspecified: Secondary | ICD-10-CM | POA: Diagnosis not present

## 2022-10-08 DIAGNOSIS — F419 Anxiety disorder, unspecified: Secondary | ICD-10-CM | POA: Diagnosis not present

## 2022-10-08 DIAGNOSIS — F32A Depression, unspecified: Secondary | ICD-10-CM | POA: Diagnosis not present

## 2022-10-22 ENCOUNTER — Ambulatory Visit (INDEPENDENT_AMBULATORY_CARE_PROVIDER_SITE_OTHER): Payer: BC Managed Care – PPO | Admitting: Family Medicine

## 2022-10-22 ENCOUNTER — Encounter: Payer: Self-pay | Admitting: Family Medicine

## 2022-10-22 VITALS — BP 122/80 | HR 93 | Temp 97.7°F | Resp 16 | Ht 63.0 in | Wt 145.2 lb

## 2022-10-22 DIAGNOSIS — R110 Nausea: Secondary | ICD-10-CM

## 2022-10-22 LAB — CBC WITH DIFFERENTIAL/PLATELET
Basophils Absolute: 0 10*3/uL (ref 0.0–0.1)
Basophils Relative: 0.2 % (ref 0.0–3.0)
Eosinophils Absolute: 0.1 10*3/uL (ref 0.0–0.7)
Eosinophils Relative: 1.5 % (ref 0.0–5.0)
HCT: 40 % (ref 36.0–46.0)
Hemoglobin: 14 g/dL (ref 12.0–15.0)
Lymphocytes Relative: 30.6 % (ref 12.0–46.0)
Lymphs Abs: 2.3 10*3/uL (ref 0.7–4.0)
MCHC: 35 g/dL (ref 30.0–36.0)
MCV: 88.1 fl (ref 78.0–100.0)
Monocytes Absolute: 0.6 10*3/uL (ref 0.1–1.0)
Monocytes Relative: 7.3 % (ref 3.0–12.0)
Neutro Abs: 4.6 10*3/uL (ref 1.4–7.7)
Neutrophils Relative %: 60.4 % (ref 43.0–77.0)
Platelets: 291 10*3/uL (ref 150.0–400.0)
RBC: 4.54 Mil/uL (ref 3.87–5.11)
RDW: 12.3 % (ref 11.5–15.5)
WBC: 7.6 10*3/uL (ref 4.0–10.5)

## 2022-10-22 LAB — BASIC METABOLIC PANEL
BUN: 9 mg/dL (ref 6–23)
CO2: 29 mEq/L (ref 19–32)
Calcium: 9.3 mg/dL (ref 8.4–10.5)
Chloride: 101 mEq/L (ref 96–112)
Creatinine, Ser: 0.66 mg/dL (ref 0.40–1.20)
GFR: 121.28 mL/min (ref >60.00)
Glucose, Bld: 82 mg/dL (ref 70–99)
Potassium: 3.8 mEq/L (ref 3.5–5.1)
Sodium: 138 mEq/L (ref 135–145)

## 2022-10-22 LAB — HEPATIC FUNCTION PANEL
ALT: 15 U/L (ref 0–35)
AST: 16 U/L (ref 0–37)
Albumin: 4.3 g/dL (ref 3.5–5.2)
Alkaline Phosphatase: 36 U/L — ABNORMAL LOW (ref 39–117)
Bilirubin, Direct: 0.1 mg/dL (ref 0.0–0.3)
Total Bilirubin: 0.7 mg/dL (ref 0.2–1.2)
Total Protein: 6.8 g/dL (ref 6.0–8.3)

## 2022-10-22 LAB — TSH: TSH: 3.66 u[IU]/mL (ref 0.35–5.50)

## 2022-10-22 LAB — POCT URINE PREGNANCY: Preg Test, Ur: NEGATIVE

## 2022-10-22 LAB — LIPASE: Lipase: 15 U/L (ref 11.0–59.0)

## 2022-10-22 LAB — AMYLASE: Amylase: 31 U/L (ref 27–131)

## 2022-10-22 NOTE — Progress Notes (Signed)
   Subjective:    Patient ID: Jennifer Haley, female    DOB: 05-08-1996, 27 y.o.   MRN: [id number]  HPI Nausea- 4 weeks ago had GI bug.  Since then has had difficulty every time she eats- having loose bowels, nausea.  No vomiting.  Dull ache across center of abdomen.  Sxs typically improve after BM.  Sxs seem to be worse in the morning.  Is known lactose intolerant- last week stopped all dairy and sxs seemed to improve.  Denies increased heartburn or belching.  Sxs may be slightly improving.  Took a probiotic x2 weeks w/o improvement.  No RUQ pain.  No changes in medication or diet.   Review of Systems For ROS see HPI     Objective:   Physical Exam Vitals reviewed.  Constitutional:      General: She is not in acute distress.    Appearance: Normal appearance. She is not ill-appearing.  HENT:     Head: Normocephalic and atraumatic.  Cardiovascular:     Rate and Rhythm: Normal rate and regular rhythm.  Pulmonary:     Effort: Pulmonary effort is normal. No respiratory distress.     Breath sounds: Normal breath sounds.  Abdominal:     General: Bowel sounds are normal. There is no distension.     Palpations: Abdomen is soft.     Tenderness: There is no abdominal tenderness. There is no guarding or rebound.  Skin:    General: Skin is warm and dry.  Neurological:     General: No focal deficit present.     Mental Status: She is alert and oriented to person, place, and time.  Psychiatric:        Mood and Affect: Mood normal.        Behavior: Behavior normal.        Thought Content: Thought content normal.           Assessment & Plan:  Nausea- new.  Suspect this is post-viral disruption of gut biome but will check labs to assess thyroid, liver, pancreas, electrolytes, possible infection.  She will restart daily probiotic and add daily Omeprazole to decrease acid production and help w/ nausea.  She will continue to avoid dairy.  If no improvement in the next week will refer to GI for  complete evaluation.  Pt expressed understanding and is in agreement w/ plan.

## 2022-10-22 NOTE — Patient Instructions (Addendum)
Follow up as needed or as scheduled We'll notify you of your lab results and make any changes if needed Continue to avoid dairy Restart the Probiotic Add daily OTC Omeprazole to help w/ nausea Lots of water!!! Try and increase fiber intake but not all at once!! If not feeling better in the next week, let me know so we can refer to GI Call with any questions or concerns Hang in there!!!

## 2022-10-23 ENCOUNTER — Telehealth: Payer: Self-pay

## 2022-10-23 NOTE — Telephone Encounter (Signed)
Pt aware of lab results  She states she feels ok right now

## 2022-10-23 NOTE — Telephone Encounter (Signed)
-----   Message from Sheliah Hatch, MD sent at 10/23/2022  7:26 AM EDT ----- Labs look great!  Please keep me updated on how you're feeling

## 2022-10-24 ENCOUNTER — Encounter (INDEPENDENT_AMBULATORY_CARE_PROVIDER_SITE_OTHER): Payer: BC Managed Care – PPO | Admitting: Family Medicine

## 2022-10-24 DIAGNOSIS — R110 Nausea: Secondary | ICD-10-CM

## 2022-10-24 DIAGNOSIS — F32A Depression, unspecified: Secondary | ICD-10-CM | POA: Diagnosis not present

## 2022-10-24 DIAGNOSIS — F419 Anxiety disorder, unspecified: Secondary | ICD-10-CM | POA: Diagnosis not present

## 2022-10-24 DIAGNOSIS — K2900 Acute gastritis without bleeding: Secondary | ICD-10-CM

## 2022-10-31 DIAGNOSIS — F419 Anxiety disorder, unspecified: Secondary | ICD-10-CM | POA: Diagnosis not present

## 2022-10-31 DIAGNOSIS — F3341 Major depressive disorder, recurrent, in partial remission: Secondary | ICD-10-CM | POA: Diagnosis not present

## 2022-11-01 ENCOUNTER — Other Ambulatory Visit (HOSPITAL_BASED_OUTPATIENT_CLINIC_OR_DEPARTMENT_OTHER): Payer: Self-pay

## 2022-11-01 DIAGNOSIS — R110 Nausea: Secondary | ICD-10-CM | POA: Diagnosis not present

## 2022-11-01 DIAGNOSIS — K2900 Acute gastritis without bleeding: Secondary | ICD-10-CM | POA: Diagnosis not present

## 2022-11-01 MED ORDER — SUCRALFATE 1 G PO TABS
1.0000 g | ORAL_TABLET | Freq: Three times a day (TID) | ORAL | 0 refills | Status: DC
  Filled 2022-11-01: qty 90, 30d supply, fill #0

## 2022-11-01 NOTE — Telephone Encounter (Signed)
[facility] VISIT   Patient agreed to [facility] visit and is aware that copayment and coinsurance may apply. Patient was treated using telemedicine according to accepted telemedicine protocols.  Subjective:   Patient complains of abd bloating, nausea  Patient Active Problem List   Diagnosis Date Noted   Depression 07/14/2020   Physical exam 07/14/2020   Anxiety 03/09/2020   Social History   Tobacco Use   Smoking status: Never   Smokeless tobacco: Never  Substance Use Topics   Alcohol use: Yes    Comment: social    Current Outpatient Medications:    sucralfate (CARAFATE) 1 g tablet, Take 1 tablet (1 g total) by mouth 3 (three) times daily with meals., Disp: 90 tablet, Rfl: 0   escitalopram (LEXAPRO) 20 MG tablet, Take 1 tablet (20 mg total) by mouth daily., Disp: 30 tablet, Rfl: 3   ibuprofen (ADVIL,MOTRIN) 200 MG tablet, Take 400 mg by mouth every 6 (six) hours as needed for headache, mild pain or moderate pain., Disp: , Rfl:   Allergies  Allergen Reactions   Adhesive [Tape] Rash   Amoxicillin Rash    *childhood allergy* pt does not recall any specific details about reaction     Assessment and Plan:   Diagnosis: gastritis. Please see myChart communication and orders below.   No orders of the defined types were placed in this encounter.  Meds ordered this encounter  Medications   sucralfate (CARAFATE) 1 g tablet    Sig: Take 1 tablet (1 g total) by mouth 3 (three) times daily with meals.    Dispense:  90 tablet    Refill:  0    Neena Rhymes, MD 11/01/2022  A total of 7 minutes were spent by me to personally review the patient-generated inquiry, review patient records and data pertinent to assessment of the patient's problem, develop a management plan including generation of prescriptions and/or orders, and on subsequent communication with the patient through secure the MyChart portal service.   There is no separately reported E/M service related to this service in  the past 7 days nor does the patient have an upcoming soonest available appointment for this issue. This work was completed in less than 7 days.   The patient consented to this service today (see patient agreement prior to ongoing communication). Patient counseled regarding the need for in-person exam for certain conditions and was advised to call the office if any changing or worsening symptoms occur.   The codes to be used for the E/M service are: [x]   99421 for 5-10 minutes of time spent on the inquiry. []   [id number] for 11-20 minutes. []   [id number] for 21+ minutes.

## 2022-11-08 DIAGNOSIS — F419 Anxiety disorder, unspecified: Secondary | ICD-10-CM | POA: Diagnosis not present

## 2022-11-08 DIAGNOSIS — F3341 Major depressive disorder, recurrent, in partial remission: Secondary | ICD-10-CM | POA: Diagnosis not present

## 2022-11-15 ENCOUNTER — Other Ambulatory Visit (HOSPITAL_BASED_OUTPATIENT_CLINIC_OR_DEPARTMENT_OTHER): Payer: Self-pay

## 2022-11-15 ENCOUNTER — Encounter: Payer: Self-pay | Admitting: Gastroenterology

## 2022-11-15 MED ORDER — ONDANSETRON HCL 4 MG PO TABS
4.0000 mg | ORAL_TABLET | Freq: Three times a day (TID) | ORAL | 0 refills | Status: DC | PRN
  Filled 2022-11-15: qty 9, 3d supply, fill #0
  Filled 2022-11-25: qty 9, 3d supply, fill #1
  Filled 2023-02-13: qty 9, 3d supply, fill #2

## 2022-11-15 NOTE — Addendum Note (Signed)
Addended by: Sheliah Hatch on: 11/15/2022 08:33 AM   Modules accepted: Orders

## 2022-11-22 DIAGNOSIS — F419 Anxiety disorder, unspecified: Secondary | ICD-10-CM | POA: Diagnosis not present

## 2022-11-22 DIAGNOSIS — F3341 Major depressive disorder, recurrent, in partial remission: Secondary | ICD-10-CM | POA: Diagnosis not present

## 2022-11-28 DIAGNOSIS — F3341 Major depressive disorder, recurrent, in partial remission: Secondary | ICD-10-CM | POA: Diagnosis not present

## 2022-11-28 DIAGNOSIS — F419 Anxiety disorder, unspecified: Secondary | ICD-10-CM | POA: Diagnosis not present

## 2022-12-12 DIAGNOSIS — F3341 Major depressive disorder, recurrent, in partial remission: Secondary | ICD-10-CM | POA: Diagnosis not present

## 2022-12-12 DIAGNOSIS — F419 Anxiety disorder, unspecified: Secondary | ICD-10-CM | POA: Diagnosis not present

## 2022-12-24 DIAGNOSIS — H5213 Myopia, bilateral: Secondary | ICD-10-CM | POA: Diagnosis not present

## 2022-12-26 DIAGNOSIS — F3341 Major depressive disorder, recurrent, in partial remission: Secondary | ICD-10-CM | POA: Diagnosis not present

## 2022-12-26 DIAGNOSIS — F419 Anxiety disorder, unspecified: Secondary | ICD-10-CM | POA: Diagnosis not present

## 2023-01-09 DIAGNOSIS — F3341 Major depressive disorder, recurrent, in partial remission: Secondary | ICD-10-CM | POA: Diagnosis not present

## 2023-01-09 DIAGNOSIS — F419 Anxiety disorder, unspecified: Secondary | ICD-10-CM | POA: Diagnosis not present

## 2023-01-29 ENCOUNTER — Other Ambulatory Visit: Payer: Self-pay | Admitting: Family Medicine

## 2023-01-30 ENCOUNTER — Other Ambulatory Visit: Payer: Self-pay

## 2023-01-30 ENCOUNTER — Other Ambulatory Visit (HOSPITAL_COMMUNITY): Payer: Self-pay

## 2023-01-30 MED ORDER — ESCITALOPRAM OXALATE 20 MG PO TABS
20.0000 mg | ORAL_TABLET | Freq: Every day | ORAL | 3 refills | Status: DC
  Filled 2023-01-30: qty 30, 30d supply, fill #0
  Filled 2023-02-24: qty 30, 30d supply, fill #1
  Filled 2023-03-26: qty 30, 30d supply, fill #2
  Filled 2023-04-26 – 2023-05-02 (×2): qty 30, 30d supply, fill #3

## 2023-02-04 ENCOUNTER — Ambulatory Visit: Payer: BC Managed Care – PPO | Admitting: Gastroenterology

## 2023-02-04 ENCOUNTER — Other Ambulatory Visit (HOSPITAL_COMMUNITY): Payer: Self-pay

## 2023-02-04 ENCOUNTER — Encounter: Payer: Self-pay | Admitting: Gastroenterology

## 2023-02-04 VITALS — BP 122/80 | HR 72 | Ht 63.0 in | Wt 144.4 lb

## 2023-02-04 DIAGNOSIS — R110 Nausea: Secondary | ICD-10-CM

## 2023-02-04 DIAGNOSIS — R194 Change in bowel habit: Secondary | ICD-10-CM | POA: Diagnosis not present

## 2023-02-04 DIAGNOSIS — R1030 Lower abdominal pain, unspecified: Secondary | ICD-10-CM

## 2023-02-04 MED ORDER — NA SULFATE-K SULFATE-MG SULF 17.5-3.13-1.6 GM/177ML PO SOLN
1.0000 | Freq: Once | ORAL | 0 refills | Status: AC
  Filled 2023-02-04: qty 354, 1d supply, fill #0

## 2023-02-04 NOTE — Patient Instructions (Signed)
Start Benefiber  2 teaspoons in 8 ounces of liquid twice daily.  You have been scheduled for an endoscopy and colonoscopy. Please follow the written instructions given to you at your visit today.  Please pick up your prep supplies at the pharmacy within the next 1-3 days.  If you use inhalers (even only as needed), please bring them with you on the day of your procedure.  DO NOT TAKE 7 DAYS PRIOR TO TEST- Trulicity (dulaglutide) Ozempic, Wegovy (semaglutide) Mounjaro (tirzepatide) Bydureon Bcise (exanatide extended release)  DO NOT TAKE 1 DAY PRIOR TO YOUR TEST Rybelsus (semaglutide) Adlyxin (lixisenatide) Victoza (liraglutide) Byetta (exanatide) ___________________________________________________________________________  _______________________________________________________  If your blood pressure at your visit was 140/90 or greater, please contact your primary care physician to follow up on this.  _______________________________________________________  If you are age 11 or older, your body mass index should be between 23-30. Your Body mass index is 25.57 kg/m. If this is out of the aforementioned range listed, please consider follow up with your Primary Care Provider.  If you are age 53 or younger, your body mass index should be between 19-25. Your Body mass index is 25.57 kg/m. If this is out of the aformentioned range listed, please consider follow up with your Primary Care Provider.   ________________________________________________________  The [facility] providers would like to encourage you to use [facility] to communicate with providers for non-urgent requests or questions.  Due to long hold times on the telephone, sending your provider a message by [facility] may be a faster and more efficient way to get a response.  Please allow 48 business hours for a response.  Please remember that this is for non-urgent requests.   _______________________________________________________

## 2023-02-04 NOTE — Progress Notes (Signed)
02/04/2023 Jennifer Haley [id number] 1995-06-29   HISTORY OF PRESENT ILLNESS:  This is a 27 year old female who was referred here by her PCP, Dr. Beverely Low, for evaluation of change in bowel habits, nausea, and mid to lower abdominal discomfort.  She had a 2 to 3-day stint in April when she thought she had a stomach bug, but since then she has continued with a lot of nonspecific GI symptoms and has just generally not felt well.  She is having only 1-2 bowel movements a day, but they tend to be loose or diarrhea.  Describes a lot of pressure and discomfort in her abdomen.  Says that the nausea is daily.  Basic labs performed by her PCP were unrevealing.  She has been doing lactose-free diet for years, but recently also started low FODMAP diet, probiotics, using Zofran as needed, etc. she denies any rectal bleeding.  Past Medical History:  Diagnosis Date   Anxiety 2021   Depression 2016   Past Surgical History:  Procedure Laterality Date   MANDIBLE FRACTURE SURGERY      reports that she has never smoked. She has never used smokeless tobacco. She reports current alcohol use. She reports that she does not use drugs. family history includes Asthma in her brother; Cancer in her paternal grandfather; Glaucoma in her paternal grandfather; Healthy in her father and mother; Hearing loss in her maternal grandmother; Heart disease in her paternal grandfather. Allergies  Allergen Reactions   Adhesive [Tape] Rash   Amoxicillin Rash    *childhood allergy* pt does not recall any specific details about reaction       Outpatient Encounter Medications as of 02/04/2023  Medication Sig   Bacillus Coagulans-Inulin (PROBIOTIC) 1-250 BILLION-MG CAPS    Dextrose-Fructose-Sod Citrate (NAUZENE) [phone] MG CHEW    escitalopram (LEXAPRO) 20 MG tablet Take 1 tablet (20 mg total) by mouth daily.   ibuprofen (ADVIL,MOTRIN) 200 MG tablet Take 400 mg by mouth every 6 (six) hours as needed for headache, mild pain or  moderate pain.   ondansetron (ZOFRAN) 4 MG tablet Take 1 tablet (4 mg total) by mouth every 8 (eight) hours as needed for nausea or vomiting.   [DISCONTINUED] sucralfate (CARAFATE) 1 g tablet Take 1 tablet (1 g total) by mouth 3 (three) times daily with meals.   No facility-administered encounter medications on file as of 02/04/2023.     REVIEW OF SYSTEMS  : All other systems reviewed and negative except where noted in the History of Present Illness.   PHYSICAL EXAM: BP 122/80 (BP Location: Left Arm, Patient Position: Sitting, Cuff Size: Normal)   Pulse 72   Ht 5\' 3"  (1.6 m)   Wt 144 lb 6 oz (65.5 kg)   SpO2 94%   BMI 25.57 kg/m  General: Well developed white female in no acute distress Head: Normocephalic and atraumatic Eyes:  Sclerae anicteric, conjunctiva pink. Ears: Normal auditory acuity Lungs: Clear throughout to auscultation; no W/R/R. Heart: Regular rate and rhythm; no M/R/G. Abdomen: Soft, non-distended.  BS present.  Non-tender. Musculoskeletal: Symmetrical with no gross deformities  Skin: No lesions on visible extremities Extremities: No edema  Neurological: Alert oriented x 4, grossly non-focal Psychological:  Alert and cooperative. Normal mood and affect  ASSESSMENT AND PLAN: *27 year old female with change in bowel habits, nausea, and mid to lower abdominal discomfort.  She had a 2 to 3-day stint in April when she thought she had a stomach bug, but since then she has continued with a lot  of nonspecific GI symptoms and has just generally not felt well.  She is having only 1-2 bowel movements a day, but they tend to be loose or diarrhea.  Describes a lot of pressure and discomfort in her abdomen.  Says that the nausea is daily.  Basic labs performed by her PCP were unrevealing.  She has been doing lactose-free diet for years, but recently also started low FODMAP diet, probiotics, using Zofran as needed, etc.  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  She is distressed and requested GI referral.  Will plan for both EGD and colonoscopy with Dr. Leonides Schanz.  If unrevealing and symptoms persist consider CT scan.  I did suggest she try Benefiber starting with 2 teaspoons daily and increasing to twice daily if needed to see if that would help with bulking of her stools.  Could try an antispasmodic as well.  **The risks, benefits, and alternatives to EGD and colonoscopy were discussed with the patient and she consents to proceed.   CC:  Sheliah Hatch, MD

## 2023-02-05 ENCOUNTER — Other Ambulatory Visit (HOSPITAL_COMMUNITY): Payer: Self-pay

## 2023-02-05 NOTE — Progress Notes (Signed)
I agree with the assessment and plan as outlined by Ms. Zehr. 

## 2023-02-06 DIAGNOSIS — F419 Anxiety disorder, unspecified: Secondary | ICD-10-CM | POA: Diagnosis not present

## 2023-02-06 DIAGNOSIS — F3341 Major depressive disorder, recurrent, in partial remission: Secondary | ICD-10-CM | POA: Diagnosis not present

## 2023-02-14 ENCOUNTER — Other Ambulatory Visit (HOSPITAL_COMMUNITY): Payer: Self-pay

## 2023-02-14 ENCOUNTER — Other Ambulatory Visit: Payer: Self-pay | Admitting: Family Medicine

## 2023-02-14 MED ORDER — ONDANSETRON HCL 4 MG PO TABS
4.0000 mg | ORAL_TABLET | Freq: Three times a day (TID) | ORAL | 0 refills | Status: DC | PRN
  Filled 2023-02-14: qty 20, 7d supply, fill #0

## 2023-02-14 NOTE — Telephone Encounter (Signed)
Are you willing to refill Rx ?

## 2023-02-20 DIAGNOSIS — F419 Anxiety disorder, unspecified: Secondary | ICD-10-CM | POA: Diagnosis not present

## 2023-02-20 DIAGNOSIS — F3341 Major depressive disorder, recurrent, in partial remission: Secondary | ICD-10-CM | POA: Diagnosis not present

## 2023-03-06 DIAGNOSIS — F419 Anxiety disorder, unspecified: Secondary | ICD-10-CM | POA: Diagnosis not present

## 2023-03-06 DIAGNOSIS — F3341 Major depressive disorder, recurrent, in partial remission: Secondary | ICD-10-CM | POA: Diagnosis not present

## 2023-03-14 ENCOUNTER — Encounter: Payer: Self-pay | Admitting: Internal Medicine

## 2023-03-21 DIAGNOSIS — F3341 Major depressive disorder, recurrent, in partial remission: Secondary | ICD-10-CM | POA: Diagnosis not present

## 2023-03-21 DIAGNOSIS — F419 Anxiety disorder, unspecified: Secondary | ICD-10-CM | POA: Diagnosis not present

## 2023-03-26 ENCOUNTER — Other Ambulatory Visit: Payer: Self-pay

## 2023-03-27 ENCOUNTER — Encounter: Payer: Self-pay | Admitting: Internal Medicine

## 2023-03-27 ENCOUNTER — Other Ambulatory Visit (HOSPITAL_COMMUNITY): Payer: Self-pay

## 2023-03-27 ENCOUNTER — Ambulatory Visit: Payer: BC Managed Care – PPO | Admitting: Internal Medicine

## 2023-03-27 VITALS — BP 112/71 | HR 65 | Temp 98.4°F | Resp 16 | Ht 63.0 in | Wt 144.0 lb

## 2023-03-27 DIAGNOSIS — F419 Anxiety disorder, unspecified: Secondary | ICD-10-CM | POA: Diagnosis not present

## 2023-03-27 DIAGNOSIS — K2970 Gastritis, unspecified, without bleeding: Secondary | ICD-10-CM | POA: Diagnosis not present

## 2023-03-27 DIAGNOSIS — R195 Other fecal abnormalities: Secondary | ICD-10-CM

## 2023-03-27 DIAGNOSIS — R110 Nausea: Secondary | ICD-10-CM | POA: Diagnosis not present

## 2023-03-27 DIAGNOSIS — R109 Unspecified abdominal pain: Secondary | ICD-10-CM

## 2023-03-27 DIAGNOSIS — K635 Polyp of colon: Secondary | ICD-10-CM | POA: Diagnosis not present

## 2023-03-27 DIAGNOSIS — D120 Benign neoplasm of cecum: Secondary | ICD-10-CM | POA: Diagnosis not present

## 2023-03-27 DIAGNOSIS — R194 Change in bowel habit: Secondary | ICD-10-CM | POA: Diagnosis not present

## 2023-03-27 MED ORDER — SODIUM CHLORIDE 0.9 % IV SOLN: 500.0000 mL | Freq: Once | INTRAVENOUS | Status: DC

## 2023-03-27 MED ORDER — PANTOPRAZOLE SODIUM 40 MG PO TBEC
40.0000 mg | DELAYED_RELEASE_TABLET | Freq: Every day | ORAL | 3 refills | Status: DC
  Filled 2023-03-27: qty 30, 30d supply, fill #0
  Filled 2023-04-21: qty 30, 30d supply, fill #1
  Filled 2023-05-22: qty 30, 30d supply, fill #2
  Filled 2023-06-20: qty 30, 30d supply, fill #3

## 2023-03-27 NOTE — Progress Notes (Signed)
Uneventful anesthetic. Report to pacu rn. Vss. Care resumed by rn. 

## 2023-03-27 NOTE — Patient Instructions (Addendum)
Recommendation:           - Discharge patient to home (with escort).                           - Await pathology results.                           - The findings and recommendations were discussed                            with the patient. Recommendation: Recommendation:           - Await pathology results.                           - Pantoprazole 40 mg QD for 8 weeks to help with                            healing of gastritis.                           - Return to GI clinic in 2-3 months for follow up.                           - Perform a colonoscopy today.  Handout on polyps and hemorrhoids given.    YOU HAD AN ENDOSCOPIC PROCEDURE TODAY AT THE [facility]:   Refer to the procedure report that was given to you for any specific questions about what was found during the examination.  If the procedure report does not answer your questions, please call your gastroenterologist to clarify.  If you requested that your care partner not be given the details of your procedure findings, then the procedure report has been included in a sealed envelope for you to review at your convenience later.  YOU SHOULD EXPECT: Some feelings of bloating in the abdomen. Passage of more gas than usual.  Walking can help get rid of the air that was put into your GI tract during the procedure and reduce the bloating. If you had a lower endoscopy (such as a colonoscopy or flexible sigmoidoscopy) you may notice spotting of blood in your stool or on the toilet paper. If you underwent a bowel prep for your procedure, you may not have a normal bowel movement for a few days.  Please Note:  You might notice some irritation and congestion in your nose or some drainage.  This is from the oxygen used during your procedure.  There is no need for concern and it should clear up in a day or so.  SYMPTOMS TO REPORT IMMEDIATELY:  Following lower endoscopy (colonoscopy or flexible sigmoidoscopy):  Excessive amounts of  blood in the stool  Significant tenderness or worsening of abdominal pains  Swelling of the abdomen that is new, acute  Fever of 100F or higher  Following upper endoscopy (EGD)  Vomiting of blood or coffee ground material  New chest pain or pain under the shoulder blades  Painful or persistently difficult swallowing  New shortness of breath  Fever of 100F or higher  Black, tarry-looking stools  For urgent or emergent issues, a gastroenterologist can be reached at any hour by calling [phone]. Do not use  MyChart messaging for urgent concerns.    DIET:  We do recommend a small meal at first, but then you may proceed to your regular diet.  Drink plenty of fluids but you should avoid alcoholic beverages for 24 hours.  ACTIVITY:  You should plan to take it easy for the rest of today and you should NOT DRIVE or use heavy machinery until tomorrow (because of the sedation medicines used during the test).    FOLLOW UP: Our staff will call the number listed on your records the next business day following your procedure.  We will call around 7:15- 8:00 am to check on you and address any questions or concerns that you may have regarding the information given to you following your procedure. If we do not reach you, we will leave a message.     If any biopsies were taken you will be contacted by phone or by letter within the next 1-3 weeks.  Please call us at [phone] if you have not heard about the biopsies in 3 weeks.    SIGNATURES/CONFIDENTIALITY: You and/or your care partner have signed paperwork which will be entered into your electronic medical record.  These signatures attest to the fact that that the information above on your After Visit Summary has been reviewed and is understood.  Full responsibility of the confidentiality of this discharge information lies with you and/or your care-partner.

## 2023-03-27 NOTE — Progress Notes (Signed)
Called to room to assist during endoscopic procedure.  Patient ID and intended procedure confirmed with present staff. Received instructions for my participation in the procedure from the performing physician.  

## 2023-03-27 NOTE — Op Note (Signed)
[facility] Patient Name: Jennifer Haley Procedure Date: 03/27/2023 8:33 AM MRN: [id number] Endoscopist: Madelyn Brunner [place] , , [id number] Age: 27 Referring MD:  Date of Birth: [birth year] Gender: Female Account #: [id number] Procedure:                Upper GI endoscopy Indications:              Abdominal bloating, Diarrhea, Nausea Medicines:                Monitored Anesthesia Care Procedure:                Pre-Anesthesia Assessment:                           - Prior to the procedure, a History and Physical                            was performed, and patient medications and                            allergies were reviewed. The patient's tolerance of                            previous anesthesia was also reviewed. The risks                            and benefits of the procedure and the sedation                            options and risks were discussed with the patient.                            All questions were answered, and informed consent                            was obtained. Prior Anticoagulants: The patient has                            taken no anticoagulant or antiplatelet agents. ASA                            Grade Assessment: II - A patient with mild systemic                            disease. After reviewing the risks and benefits,                            the patient was deemed in satisfactory condition to                            undergo the procedure.                           After obtaining informed consent, the endoscope was  passed under direct vision. Throughout the                            procedure, the patient's blood pressure, pulse, and                            oxygen saturations were monitored continuously. The                            GIF [id number] #[id number] was introduced through the                            mouth, and advanced to the second part of duodenum. Scope In: Scope Out: Findings:                  The examined esophagus was normal.                           Localized inflammation characterized by congestion                            (edema), erosions and erythema was found in the                            gastric antrum. Biopsies were taken with a cold                            forceps for histology.                           The examined duodenum was normal. Biopsies were                            taken with a cold forceps for histology. Complications:            No immediate complications. Estimated Blood Loss:     Estimated blood loss was minimal. Impression:               - Normal esophagus.                           - Gastritis. Biopsied.                           - Normal examined duodenum. Biopsied. Recommendation:           - Await pathology results.                           - Pantoprazole 40 mg QD for 8 weeks to help with                            healing of gastritis.                           - Return to GI clinic in 2-3 months for follow up.                           -  Perform a colonoscopy today. Dr Particia Lather "Alan Ripper" Leonides Schanz,  03/27/2023 9:16:30 AM

## 2023-03-27 NOTE — Progress Notes (Signed)
GASTROENTEROLOGY PROCEDURE H&P NOTE   Primary Care Physician: Sheliah Hatch, MD    Reason for Procedure:   Change in bowel habits, abdominal discomfort, loose stools, nausea  Plan:    EGD/colonoscopy  Patient is appropriate for endoscopic procedure(s) in the ambulatory ([facility]) setting.  The nature of the procedure, as well as the risks, benefits, and alternatives were carefully and thoroughly reviewed with the patient. Ample time for discussion and questions allowed. The patient understood, was satisfied, and agreed to proceed.     HPI: Jennifer Haley is a 27 y.o. female who presents for EGD/colonoscopy for evaluation of change in bowel habits, abdominal discomfort, loose stools, nausea.  Patient was most recently seen in the Gastroenterology Clinic on 02/04/23.  No interval change in medical history since that appointment. Please refer to that note for full details regarding GI history and clinical presentation.   Past Medical History:  Diagnosis Date   Anxiety 2021   Depression 2016    Past Surgical History:  Procedure Laterality Date   MANDIBLE FRACTURE SURGERY      Prior to Admission medications   Medication Sig Start Date End Date Taking? Authorizing Provider  APAP-Pamabrom-Pyrilamine (PAMPRIN MAX PAIN FORMULA) 500-25-15 MG TABS Take by mouth.   Yes [provider]  ASHWAGANDHA PO Root powder 500mg  11/22/22  Yes [provider]  Bacillus Coagulans-Inulin (PROBIOTIC) 1-250 BILLION-MG CAPS  11/21/22  Yes [provider]  Dextrose-Fructose-Sod Citrate Loletha Carrow) [phone] MG CHEW  11/13/22  Yes [provider]  escitalopram (LEXAPRO) 20 MG tablet Take 1 tablet (20 mg total) by mouth daily. 01/30/23  Yes Sheliah Hatch, MD  ibuprofen (ADVIL,MOTRIN) 200 MG tablet Take 400 mg by mouth every 6 (six) hours as needed for headache, mild pain or moderate pain.   Yes [provider]  ondansetron (ZOFRAN) 4 MG tablet Take 1 tablet (4  mg total) by mouth every 8 (eight) hours as needed for nausea or vomiting. 02/14/23  Yes Sheliah Hatch, MD    Current Outpatient Medications  Medication Sig Dispense Refill   APAP-Pamabrom-Pyrilamine (PAMPRIN MAX PAIN FORMULA) 500-25-15 MG TABS Take by mouth.     ASHWAGANDHA PO Root powder 500mg      Bacillus Coagulans-Inulin (PROBIOTIC) 1-250 BILLION-MG CAPS      Dextrose-Fructose-Sod Citrate (NAUZENE) 160-109-323 MG CHEW      escitalopram (LEXAPRO) 20 MG tablet Take 1 tablet (20 mg total) by mouth daily. 30 tablet 3   ibuprofen (ADVIL,MOTRIN) 200 MG tablet Take 400 mg by mouth every 6 (six) hours as needed for headache, mild pain or moderate pain.     ondansetron (ZOFRAN) 4 MG tablet Take 1 tablet (4 mg total) by mouth every 8 (eight) hours as needed for nausea or vomiting. 20 tablet 0   Current Facility-Administered Medications  Medication Dose Route Frequency Provider Last Rate Last Admin   0.9 %  sodium chloride infusion  500 mL Intravenous Once Imogene Burn, MD        Allergies as of 03/27/2023 - Review Complete 03/27/2023  Allergen Reaction Noted   Adhesive [tape] Rash 03/15/2015   Amoxicillin Rash 03/15/2015    Family History  Problem Relation Age of Onset   Healthy Mother    Healthy Father    Asthma Brother    Hearing loss Maternal Grandmother    Heart disease Paternal Grandfather    Cancer Paternal Grandfather        lung   Glaucoma Paternal Grandfather    Colon cancer  Neg Hx    Esophageal cancer Neg Hx    Stomach cancer Neg Hx    Rectal cancer Neg Hx     Social History   Socioeconomic History   Marital status: Married    Spouse name: Not on file   Number of children: 0   Years of education: Not on file   Highest education level: Master's degree (e.g., MA, MS, MEng, MEd, MSW, MBA)  Occupational History    Comment: student: Engineer, drilling    Comment: Works at [employer]  Tobacco Use   Smoking status: Never   Smokeless tobacco:  Never  Vaping Use   Vaping status: Never Used  Substance and Sexual Activity   Alcohol use: Yes    Comment: social   Drug use: No   Sexual activity: Yes    Birth control/protection: Condom  Other Topics Concern   Not on file  Social History Narrative   Not on file   Social Determinants of Health   Financial Resource Strain: Low Risk  (10/20/2022)   Overall Financial Resource Strain (CARDIA)    Difficulty of Paying Living Expenses: Not very hard  Food Insecurity: No Food Insecurity (10/20/2022)   Hunger Vital Sign    Worried About Running Out of Food in the Last Year: Never true    Ran Out of Food in the Last Year: Never true  Transportation Needs: No Transportation Needs (10/20/2022)   PRAPARE - Administrator, Civil Service (Medical): No    Lack of Transportation (Non-Medical): No  Physical Activity: Sufficiently Active (10/20/2022)   Exercise Vital Sign    Days of Exercise per Week: 7 days    Minutes of Exercise per Session: 30 min  Stress: Stress Concern Present (10/20/2022)   [employer] of Occupational Health - Occupational Stress Questionnaire    Feeling of Stress : To some extent  Social Connections: Moderately Integrated (10/20/2022)   Social Connection and Isolation Panel [NHANES]    Frequency of Communication with Friends and Family: More than three times a week    Frequency of Social Gatherings with Friends and Family: Twice a week    Attends Religious Services: 1 to 4 times per year    Active Member of [employer] or Organizations: No    Attends Banker Meetings: Not on file    Marital Status: Married  Intimate Partner Violence: Unknown (07/29/2018)   Humiliation, Afraid, Rape, and Kick questionnaire    Fear of Current or Ex-Partner: Patient declined    Emotionally Abused: Patient declined    Physically Abused: Patient declined    Sexually Abused: Patient declined    Physical Exam: Vital signs in last 24 hours: BP 101/62   Pulse 75   Temp  98.4 F (36.9 C) (Temporal)   Ht 5\' 3"  (1.6 m)   Wt 144 lb (65.3 kg)   LMP 03/25/2023   SpO2 99%   BMI 25.51 kg/m  GEN: NAD EYE: Sclerae anicteric ENT: MMM CV: Non-tachycardic Pulm: No increased WOB GI: Soft NEURO:  Alert & Oriented   Eulah Pont, MD [facility]   03/27/2023 8:27 AM

## 2023-03-27 NOTE — Op Note (Signed)
[facility] Patient Name: Jennifer Haley Procedure Date: 03/27/2023 8:29 AM MRN: [id number] Endoscopist: Madelyn Brunner [place] , , [id number] Age: 27 Referring MD:  Date of Birth: [birth year] Gender: Female Account #: [id number] Procedure:                Colonoscopy Indications:              Lower abdominal pain, Diarrhea, Change in bowel                            habits Medicines:                Monitored Anesthesia Care Procedure:                Pre-Anesthesia Assessment:                           - Prior to the procedure, a History and Physical                            was performed, and patient medications and                            allergies were reviewed. The patient's tolerance of                            previous anesthesia was also reviewed. The risks                            and benefits of the procedure and the sedation                            options and risks were discussed with the patient.                            All questions were answered, and informed consent                            was obtained. Prior Anticoagulants: The patient has                            taken no anticoagulant or antiplatelet agents. ASA                            Grade Assessment: II - A patient with mild systemic                            disease. After reviewing the risks and benefits,                            the patient was deemed in satisfactory condition to                            undergo the procedure.  After obtaining informed consent, the colonoscope                            was passed under direct vision. Throughout the                            procedure, the patient's blood pressure, pulse, and                            oxygen saturations were monitored continuously. The                            Olympus scope [id number] was introduced through the                            anus and advanced to the the terminal ileum. The                             colonoscopy was performed without difficulty. The                            patient tolerated the procedure well. The quality                            of the bowel preparation was excellent. The                            terminal ileum, ileocecal valve, appendiceal                            orifice, and rectum were photographed. Scope In: 8:51:26 AM Scope Out: 9:08:25 AM Scope Withdrawal Time: 0 hours 14 minutes 36 seconds  Total Procedure Duration: 0 hours 16 minutes 59 seconds  Findings:                 The terminal ileum appeared normal.                           A 6 mm polyp was found in the cecum. The polyp was                            sessile. The polyp was removed with a cold snare.                            Resection and retrieval were complete.                           Non-bleeding internal hemorrhoids were found during                            retroflexion. The hemorrhoids were small.                           Biopsies for histology were taken with a cold  forceps from the entire colon for evaluation of                            microscopic colitis. Complications:            No immediate complications. Estimated Blood Loss:     Estimated blood loss was minimal. Impression:               - The examined portion of the ileum was normal.                           - One 6 mm polyp in the cecum, removed with a cold                            snare. Resected and retrieved.                           - Non-bleeding internal hemorrhoids.                           - Biopsies were taken with a cold forceps from the                            entire colon for evaluation of microscopic colitis. Recommendation:           - Discharge patient to home (with escort).                           - Await pathology results.                           - The findings and recommendations were discussed                            with the  patient. Dr Particia Lather "Alan Ripper" Leonides Schanz,  03/27/2023 9:19:30 AM

## 2023-03-28 ENCOUNTER — Telehealth: Payer: Self-pay

## 2023-03-28 NOTE — Telephone Encounter (Signed)
  Follow up Call-     03/27/2023    8:06 AM  Call back number  Post procedure Call Back phone  # [phone]  Permission to leave phone message Yes     Patient questions:  Do you have a fever, pain , or abdominal swelling? No. Pain Score  0 *  Have you tolerated food without any problems? Yes.    Have you been able to return to your normal activities? Yes.    Do you have any questions about your discharge instructions: Diet   No. Medications  No. Follow up visit  No.  Do you have questions or concerns about your Care? No.  Actions: * If pain score is 4 or above: No action needed, pain <4.

## 2023-03-31 ENCOUNTER — Encounter: Payer: Self-pay | Admitting: Internal Medicine

## 2023-03-31 LAB — SURGICAL PATHOLOGY

## 2023-04-01 ENCOUNTER — Encounter: Payer: Self-pay | Admitting: Family Medicine

## 2023-04-01 ENCOUNTER — Encounter: Payer: Self-pay | Admitting: Internal Medicine

## 2023-04-01 DIAGNOSIS — R110 Nausea: Secondary | ICD-10-CM

## 2023-04-01 DIAGNOSIS — R1030 Lower abdominal pain, unspecified: Secondary | ICD-10-CM

## 2023-04-01 DIAGNOSIS — R195 Other fecal abnormalities: Secondary | ICD-10-CM

## 2023-04-03 DIAGNOSIS — F419 Anxiety disorder, unspecified: Secondary | ICD-10-CM | POA: Diagnosis not present

## 2023-04-03 DIAGNOSIS — F3341 Major depressive disorder, recurrent, in partial remission: Secondary | ICD-10-CM | POA: Diagnosis not present

## 2023-04-03 NOTE — Telephone Encounter (Signed)
Patient is willing to try hormone testing, also would like to be referred to an allergist for food allergy testing please advise

## 2023-04-04 ENCOUNTER — Other Ambulatory Visit: Payer: Self-pay

## 2023-04-04 ENCOUNTER — Other Ambulatory Visit (HOSPITAL_BASED_OUTPATIENT_CLINIC_OR_DEPARTMENT_OTHER): Payer: Self-pay

## 2023-04-04 MED ORDER — DICYCLOMINE HCL 10 MG PO CAPS
10.0000 mg | ORAL_CAPSULE | Freq: Three times a day (TID) | ORAL | 0 refills | Status: DC | PRN
  Filled 2023-04-04 – 2023-04-07 (×2): qty 90, 30d supply, fill #0

## 2023-04-04 NOTE — Addendum Note (Signed)
Addended by: Sheliah Hatch on: 04/04/2023 07:57 AM   Modules accepted: Orders

## 2023-04-07 ENCOUNTER — Other Ambulatory Visit (HOSPITAL_COMMUNITY): Payer: Self-pay

## 2023-04-07 ENCOUNTER — Other Ambulatory Visit (HOSPITAL_BASED_OUTPATIENT_CLINIC_OR_DEPARTMENT_OTHER): Payer: Self-pay

## 2023-04-08 ENCOUNTER — Other Ambulatory Visit (HOSPITAL_COMMUNITY): Payer: Self-pay

## 2023-04-17 DIAGNOSIS — F3341 Major depressive disorder, recurrent, in partial remission: Secondary | ICD-10-CM | POA: Diagnosis not present

## 2023-04-17 DIAGNOSIS — F419 Anxiety disorder, unspecified: Secondary | ICD-10-CM | POA: Diagnosis not present

## 2023-04-28 ENCOUNTER — Encounter: Payer: Self-pay | Admitting: Pharmacist

## 2023-04-28 ENCOUNTER — Other Ambulatory Visit: Payer: Self-pay

## 2023-04-30 ENCOUNTER — Other Ambulatory Visit (HOSPITAL_BASED_OUTPATIENT_CLINIC_OR_DEPARTMENT_OTHER): Payer: Self-pay

## 2023-04-30 ENCOUNTER — Other Ambulatory Visit (HOSPITAL_COMMUNITY): Payer: Self-pay

## 2023-05-01 ENCOUNTER — Other Ambulatory Visit: Payer: Self-pay

## 2023-05-01 DIAGNOSIS — F419 Anxiety disorder, unspecified: Secondary | ICD-10-CM | POA: Diagnosis not present

## 2023-05-01 DIAGNOSIS — F3341 Major depressive disorder, recurrent, in partial remission: Secondary | ICD-10-CM | POA: Diagnosis not present

## 2023-05-02 ENCOUNTER — Other Ambulatory Visit (HOSPITAL_COMMUNITY): Payer: Self-pay

## 2023-05-02 ENCOUNTER — Other Ambulatory Visit: Payer: Self-pay

## 2023-05-09 ENCOUNTER — Encounter: Payer: Self-pay | Admitting: Internal Medicine

## 2023-05-09 ENCOUNTER — Ambulatory Visit: Payer: BC Managed Care – PPO | Admitting: Internal Medicine

## 2023-05-09 VITALS — BP 110/70 | HR 96 | Temp 97.7°F | Resp 16 | Ht 63.0 in | Wt 153.2 lb

## 2023-05-09 DIAGNOSIS — R109 Unspecified abdominal pain: Secondary | ICD-10-CM | POA: Diagnosis not present

## 2023-05-09 DIAGNOSIS — Z8489 Family history of other specified conditions: Secondary | ICD-10-CM | POA: Diagnosis not present

## 2023-05-09 DIAGNOSIS — Z8379 Family history of other diseases of the digestive system: Secondary | ICD-10-CM | POA: Diagnosis not present

## 2023-05-09 DIAGNOSIS — K30 Functional dyspepsia: Secondary | ICD-10-CM

## 2023-05-09 NOTE — Progress Notes (Signed)
NEW PATIENT Date of Service/Encounter:  05/09/23 Referring provider: Sheliah Hatch, MD Primary care provider: Sheliah Hatch, MD  Subjective:  Jennifer Haley is a 27 y.o. female with a PMHx of anxiety, depression presenting today for evaluation of chronic abdominal pain History obtained from: chart review and patient and spouse .   Discussed the use of AI scribe software for clinical note transcription with the patient, who gave verbal consent to proceed.  History of Present Illness   The patient, with a history of gastrointestinal issues, presents with persistent stomach discomfort and loose stools. The symptoms began in April, following a 24-hour stomach bug characterized by diarrhea and nausea. Despite the resolution of the initial illness, the patient continued to experience stomach discomfort, nausea, and a sensation of pressure, along with persistent loose stools.  Multiple antacids were trialed without significant relief. The patient was subsequently started on pantoprazole (Protonix), which led to some improvement. An endoscopy and colonoscopy were performed in October, revealing a single polyp but no other abnormalities. Despite the use of Protonix, the patient continued to experience stomach spasms, for which Bentyl was prescribed. This medication has been effective in controlling the spasms, although the patient still experiences intermittent nausea and requires Bentyl approximately once or twice a week.  The patient has attempted dietary modifications, including a low FODMAP diet and the elimination of gluten and dairy, with minimal improvement. Recently, the patient has begun reintroducing certain foods, such as onions, garlic, and mushrooms, without exacerbation of symptoms. However, the reintroduction of brussel sprouts was not well-tolerated. The patient suspects a potential food allergy but has been unable to identify a specific trigger. Symptoms typically occur  approximately 30 minutes to an hour after eating and can last several hours if not treated with medication.  The patient has a family history of gastrointestinal issues and food allergies. The patient's mother has reflux and is intolerant to gluten, while a sibling has severe food allergies requiring an EpiPen. 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 However, the patient has not experienced any similar reactions since.        Chart Review:  Reviewed PCP notes from referral 04/04/23: patient portal request: I have just completed my endoscopy and colonoscopy last week and received results on all as of today. I did have a polyp that was collected and tested, but all else looked good. I am still experiencing issues with my stomach, and am hoping to talk to you about either hormone or allergy testing in the near future to rule those out and continue getting closer to an answer. "  Upper endoscopy 03/27/23:  Endoscopy 03/27/23 :   Pathology report: 03/27/23: duodenal mucosa with prominent Brunner's glands, no H. pylori, antral and oxyntic mucosa without pathology, sessile serrated polyp without dysplasia ain colon/cecum.  Colonic mucosa without pathology  Past Medical History: Past Medical History:  Diagnosis Date   Anxiety 2021   Depression 2016   Medication List:  Current Outpatient Medications  Medication Sig Dispense Refill   APAP-Pamabrom-Pyrilamine (PAMPRIN MAX PAIN FORMULA) 500-25-15 MG TABS Take by mouth.     Bacillus Coagulans-Inulin (PROBIOTIC) 1-250 BILLION-MG CAPS      Dextrose-Fructose-Sod Citrate (NAUZENE) [phone] MG CHEW      dicyclomine (BENTYL) 10 MG capsule Take 1 capsule (10 mg total) by mouth 3 (three) times daily as needed for spasms. 90 capsule 0   escitalopram (LEXAPRO) 20 MG tablet  Take 1 tablet (20 mg total) by mouth daily. 30 tablet 3   ibuprofen (ADVIL,MOTRIN) 200 MG tablet  Take 400 mg by mouth every 6 (six) hours as needed for headache, mild pain or moderate pain.     ondansetron (ZOFRAN) 4 MG tablet Take 1 tablet (4 mg total) by mouth every 8 (eight) hours as needed for nausea or vomiting. 20 tablet 0   pantoprazole (PROTONIX) 40 MG tablet Take 1 tablet (40 mg total) by mouth daily. 60 tablet 3   ASHWAGANDHA PO Root powder 500mg      No current facility-administered medications for this visit.   Known Allergies:  Allergies  Allergen Reactions   Adhesive [Tape] Rash   Amoxicillin Rash    *childhood allergy* pt does not recall any specific details about reaction    Past Surgical History: Past Surgical History:  Procedure Laterality Date   MANDIBLE FRACTURE SURGERY     Family History: Family History  Problem Relation Age of Onset   Healthy Mother    Healthy Father    Asthma Brother    Hearing loss Maternal Grandmother    Heart disease Paternal Grandfather    Cancer Paternal Grandfather        lung   Glaucoma Paternal Grandfather    Colon cancer Neg Hx    Esophageal cancer Neg Hx    Stomach cancer Neg Hx    Rectal cancer Neg Hx    Social History: Jennifer Haley lives in a townhouse built in 2010, no water damage, carpet in the bedroom, indoor dog, not using dust mite covers on the bed or the pillows, works as an Engineer, building services, 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  + HEPA [place]   ROS:  All other systems negative except as noted per HPI.  Objective:  Blood pressure 110/70, pulse 96, temperature 97.7 F (36.5 C), temperature source Temporal, resp. rate 16, height 5\' 3"  (1.6 m), weight 153 lb 3.2 oz (69.5 kg), SpO2 97%. Body mass index is 27.14 kg/m. Physical Exam:  General Appearance:  Alert, cooperative, no distress, appears stated age  Head:  Normocephalic, without obvious abnormality, atraumatic  Eyes:  Conjunctiva clear, EOM's intact  Nose: Nares normal, no rhinorrhea  Throat: Lips, tongue  normal; teeth and gums normal, normal posterior oropharynx  Neck: Supple, symmetrical  Abdomen Bowel sounds present, soft, nontender, no HSM  Lungs:   clear to auscultation bilaterally, Respirations unlabored, no coughing  Heart:  regular rate and rhythm and no murmur, Appears well perfused  Extremities: No edema  Skin: Skin color, texture, turgor normal and no rashes or lesions on visualized portions of skin  Neurologic: No gross deficits   Diagnostics: Labs:  Lab Orders         Alpha-Gal Panel       Assessment and Plan  Assessment and Plan    Chronic Gastrointestinal Upset Persistent nausea, abdominal pain, and loose stools since April 2024. No improvement with low FODMAP diet, gluten and dairy elimination, or various antacids including omeprazole and pantoprazole. Currently on pantoprazole 40mg  daily and Bentyl as needed for spasms. No findings on colonoscopy and endoscopy to explain symptoms. Discussed in detail the difference between food allergy versus food intolerance versus food sensitivity.  No symptoms of type I food allergy, food allergy testing not indicated but offered for reassurance.  Patient declined after thoughtful consideration. Food elimination diets have not been successful up to this point, would focus more  on GI symptom control. -Continue pantoprazole 40mg  daily and Bentyl as needed. -Order alpha-gal allergy blood test to rule out delayed food allergy. There have been case reports of recurrent vomiting/stomach pain and alpha gal in children. -Consider consultation with GI specialist for further evaluation and management.  Family History of Food Allergies Brother with severe nut allergy and anaphylaxis. Mother with gluten intolerance. -No immediate action required as patient does not exhibit symptoms of food allergies.  Follow up : as needed It was a pleasure meeting you in clinic today! Thank you for allowing me to participate in your care.  Tonny Bollman,  MD [facility]        This note in its entirety was forwarded to the Provider who requested this consultation.  Other: none  Thank you for your kind referral. I appreciate the opportunity to take part in Jennifer Haley's care. Please do not hesitate to contact me with questions.  Sincerely,  Tonny Bollman, MD [facility]

## 2023-05-09 NOTE — Patient Instructions (Signed)
Chronic Gastrointestinal Upset Persistent nausea, abdominal pain, and loose stools since April 2024. No improvement with low FODMAP diet, gluten and dairy elimination, or various antacids including omeprazole and pantoprazole. Currently on pantoprazole 40mg  daily and Bentyl as needed for spasms. No findings on colonoscopy and endoscopy to explain symptoms. Discussed in detail the difference between food allergy versus food intolerance versus food sensitivity.  No symptoms of type I food allergy, food allergy testing not indicated but offered for reassurance.  Food elimination diets have not been successful up to this point, would focus more on GI symptom control. -Continue pantoprazole 40mg  daily and Bentyl as needed. -Order alpha-gal allergy blood test to rule out delayed food allergy. -Consider consultation with GI specialist for further evaluation and management.  Family History of Food Allergies Brother with severe nut allergy and anaphylaxis. Mother with gluten intolerance. -No immediate action required as patient does not exhibit symptoms of food allergies.  Follow up : as needed It was a pleasure meeting you in clinic today! Thank you for allowing me to participate in your care.

## 2023-05-11 LAB — ALPHA-GAL PANEL
Allergen Lamb IgE: <0.1 kU/L
Beef IgE: <0.1 kU/L
IgE (Immunoglobulin E), Serum: 2 [IU]/mL — ABNORMAL LOW (ref 6–495)
O215-IgE Alpha-Gal: <0.1 kU/L
Pork IgE: <0.1 kU/L

## 2023-05-12 NOTE — Progress Notes (Signed)
Please let Mrs. Pert know that her alpha gal testing is negative.  Also, if this is any reassurance for her, her IgE antibody level (the allergic antibody level) is low-this makes any type of allergy for her very unlikely.

## 2023-05-20 ENCOUNTER — Ambulatory Visit: Payer: BC Managed Care – PPO | Admitting: Internal Medicine

## 2023-05-25 ENCOUNTER — Other Ambulatory Visit: Payer: Self-pay | Admitting: Family Medicine

## 2023-05-26 ENCOUNTER — Other Ambulatory Visit (HOSPITAL_COMMUNITY): Payer: Self-pay

## 2023-05-26 MED ORDER — ESCITALOPRAM OXALATE 20 MG PO TABS
20.0000 mg | ORAL_TABLET | Freq: Every day | ORAL | 3 refills | Status: DC
  Filled 2023-05-26: qty 30, 30d supply, fill #0
  Filled 2023-06-26: qty 30, 30d supply, fill #1
  Filled 2023-07-28: qty 30, 30d supply, fill #2
  Filled 2023-08-30: qty 30, 30d supply, fill #3

## 2023-05-29 ENCOUNTER — Ambulatory Visit: Payer: BC Managed Care – PPO | Admitting: Internal Medicine

## 2023-06-26 DIAGNOSIS — F419 Anxiety disorder, unspecified: Secondary | ICD-10-CM | POA: Diagnosis not present

## 2023-06-26 DIAGNOSIS — F3341 Major depressive disorder, recurrent, in partial remission: Secondary | ICD-10-CM | POA: Diagnosis not present

## 2023-07-08 ENCOUNTER — Encounter: Payer: Self-pay | Admitting: Internal Medicine

## 2023-07-08 ENCOUNTER — Other Ambulatory Visit (HOSPITAL_COMMUNITY): Payer: Self-pay

## 2023-07-08 ENCOUNTER — Ambulatory Visit: Payer: BC Managed Care – PPO | Admitting: Internal Medicine

## 2023-07-08 VITALS — BP 110/70 | HR 90 | Ht 63.0 in | Wt 157.0 lb

## 2023-07-08 DIAGNOSIS — R109 Unspecified abdominal pain: Secondary | ICD-10-CM

## 2023-07-08 DIAGNOSIS — Z860100 Personal history of colon polyps, unspecified: Secondary | ICD-10-CM

## 2023-07-08 DIAGNOSIS — K219 Gastro-esophageal reflux disease without esophagitis: Secondary | ICD-10-CM | POA: Diagnosis not present

## 2023-07-08 DIAGNOSIS — K589 Irritable bowel syndrome without diarrhea: Secondary | ICD-10-CM

## 2023-07-08 DIAGNOSIS — R110 Nausea: Secondary | ICD-10-CM

## 2023-07-08 MED ORDER — PANTOPRAZOLE SODIUM 40 MG PO TBEC
40.0000 mg | DELAYED_RELEASE_TABLET | Freq: Every day | ORAL | 3 refills | Status: DC
  Filled 2023-07-08: qty 60, 60d supply, fill #0
  Filled 2023-07-18: qty 30, 30d supply, fill #0
  Filled 2023-08-17: qty 30, 30d supply, fill #1
  Filled 2023-09-15: qty 30, 30d supply, fill #2
  Filled 2023-10-09: qty 30, 30d supply, fill #3
  Filled 2023-11-03 – 2023-11-08 (×3): qty 30, 30d supply, fill #4
  Filled 2023-12-01: qty 30, 30d supply, fill #5
  Filled 2023-12-31 – 2024-01-05 (×2): qty 30, 30d supply, fill #6
  Filled 2024-02-01: qty 30, 30d supply, fill #7

## 2023-07-08 MED ORDER — DICYCLOMINE HCL 10 MG PO CAPS
10.0000 mg | ORAL_CAPSULE | Freq: Three times a day (TID) | ORAL | 0 refills | Status: DC | PRN
  Filled 2023-07-08: qty 90, 30d supply, fill #0

## 2023-07-08 NOTE — Progress Notes (Signed)
07/08/2023 Jennifer Haley [id number] [birth year]   HISTORY OF PRESENT ILLNESS:  This is a 28 year old female with history of colon polyp and GERD presents for follow up of nausea, abdominal discomfort, and change in bowel habits.  Interval History: She is taking pantoprazole every day, and she feels pretty normal on this medication.  When she doesn't take the pantoprazole, she will notice a recurrence in her symptoms. Pantoprazole has helped with her nausea and abdominal discomfort. Bentyl has also been very helpful for abdominal discomfort. Bowel habits are regular. She has been able to stop the low FODMAP diet. By doing the low FODMAP diet, she has figured out some foods that are not well tolerated. Fiber did not really help with her symptoms so she is no longer taking this.  Past Medical History:  Diagnosis Date   Anxiety 2021   Depression 2016   Past Surgical History:  Procedure Laterality Date   MANDIBLE FRACTURE SURGERY      reports that she has never smoked. She has never used smokeless tobacco. She reports current alcohol use. She reports that she does not use drugs. family history includes Asthma in her brother; Cancer in her paternal grandfather; Glaucoma in her paternal grandfather; Healthy in her father and mother; Hearing loss in her maternal grandmother; Heart disease in her paternal grandfather. Allergies  Allergen Reactions   Adhesive [Tape] Rash   Amoxicillin Rash    *childhood allergy* pt does not recall any specific details about reaction       Outpatient Encounter Medications as of 07/08/2023  Medication Sig   APAP-Pamabrom-Pyrilamine (PAMPRIN MAX PAIN FORMULA) 500-25-15 MG TABS Take by mouth.   Bacillus Coagulans-Inulin (PROBIOTIC) 1-250 BILLION-MG CAPS    Dextrose-Fructose-Sod Citrate (NAUZENE) [phone] MG CHEW    escitalopram (LEXAPRO) 20 MG tablet Take 1 tablet (20 mg total) by mouth daily.   pantoprazole (PROTONIX) 40 MG tablet Take 1 tablet (40 mg  total) by mouth daily.   ASHWAGANDHA PO Root powder 500mg    dicyclomine (BENTYL) 10 MG capsule Take 1 capsule (10 mg total) by mouth 3 (three) times daily as needed for spasms. (Patient not taking: Reported on 07/08/2023)   ibuprofen (ADVIL,MOTRIN) 200 MG tablet Take 400 mg by mouth every 6 (six) hours as needed for headache, mild pain or moderate pain. (Patient not taking: Reported on 07/08/2023)   ondansetron (ZOFRAN) 4 MG tablet Take 1 tablet (4 mg total) by mouth every 8 (eight) hours as needed for nausea or vomiting. (Patient not taking: Reported on 07/08/2023)   No facility-administered encounter medications on file as of 07/08/2023.     REVIEW OF SYSTEMS  : All other systems reviewed and negative except where noted in the History of Present Illness.   PHYSICAL EXAM: BP 110/70   Pulse 90   Ht 5\' 3"  (1.6 m)   Wt 157 lb (71.2 kg)   BMI 27.81 kg/m  General: Well developed white female in no acute distress Head: Normocephalic and atraumatic Eyes:  Sclerae anicteric, conjunctiva pink. Ears: Normal auditory acuity Lungs: Clear throughout to auscultation; no W/R/R. Heart: Regular rate and rhythm; no M/R/G. Abdomen: Soft, non-distended.  BS present.  Non-tender. Musculoskeletal: Symmetrical with no gross deformities  Skin: No lesions on visible extremities Extremities: No edema  Neurological: Alert oriented x 4, grossly non-focal Psychological:  Alert and cooperative. Normal mood and affect  Labs 10/2022: CBC, CMP nml. Lipase nml.   Labs 04/2023: Alpha gal panel normal.  EGD 03/27/23: - Normal esophagus. -  Gastritis. Biopsied. - Normal examined duodenum. Biopsied. Path: 1. Surgical [P], duodenal :       -  DUODENAL MUCOSA WITH PROMINENT BRUNNER'S GLANDS, OTHERWISE NO SIGNIFICANT       PATHOLOGY.       2. Surgical [P], gastric :       -  ANTRAL AND OXYNTIC MUCOSA WITH NO SIGNIFICANT PATHOLOGY.       -  NO HELICOBACTER PYLORI ORGANISMS IDENTIFIED ON H&E STAINED SLIDE.    Colonoscopy 03/27/23: - The examined portion of the ileum was normal. - One 6 mm polyp in the cecum, removed with a cold snare. Resected and retrieved. - Non- bleeding internal hemorrhoids. - Biopsies were taken with a cold forceps from the entire colon for evaluation of microscopic colitis. Path: 3. Surgical [P], colon, cecum, polyp (1) :       - SESSILE SERRATED POLYP WITHOUT DYSPLASIA       4. Surgical [P], colon nos, random sites :       -  COLONIC MUCOSA WITH NO SIGNIFICANT PATHOLOGY.   ASSESSMENT AND PLAN: Abdominal discomfort Nausea GERD History of colon polyp Patient has had a dramatic improvement in her abdominal discomfort and nausea on a combination of PPI daily and dicyclomine as needed.  Her improvement on PPI therapy suggests to me that she has a some degree of acid reflux.  She has tried to stop her PPI therapy but finds a recurrence in her symptoms.  Thus we will plan to keep her at her current dosage for now.  I went over some conservative strategies to help with her acid reflux.  Perhaps by her next appointment, we will be able to decrease her PPI therapy.  Interestingly on her last colonoscopy patient was found to to have a precancerous colon polyp.  Not clear to me that the patient would be particularly at risk for developing colon polyps, but we will plan for standard interval surveillance colonoscopy in 2029. - Previously used low FODMAP diet to identify food triggers for her symptoms. - GERD handout - Continue pantoprazole 40 mg every day - Cont dicyclomine PRN - Next colonoscopy due in 2029 - RTC 6 months. Will discuss options for weaning down PPI during next visit.   I spent 34 minutes of time, including in depth chart review, independent review of results as outlined above, communicating results with the patient directly, face-to-face time with the patient, coordinating care, and ordering studies and medications as appropriate, and documentation.

## 2023-07-08 NOTE — Patient Instructions (Addendum)
We have sent the following medications to your pharmacy for you to pick up at your convenience: Pantoprazole, Dicyclomine  Follow up in 6 months   If your blood pressure at your visit was 140/90 or greater, please contact your primary care physician to follow up on this.  _______________________________________________________  If you are age 27 or older, your body mass index should be between 23-30. Your Body mass index is 27.81 kg/m. If this is out of the aforementioned range listed, please consider follow up with your Primary Care Provider.  If you are age 27 or younger, your body mass index should be between 19-25. Your Body mass index is 27.81 kg/m. If this is out of the aformentioned range listed, please consider follow up with your Primary Care Provider.   ________________________________________________________  The [facility] providers would like to encourage you to use [facility] to communicate with providers for non-urgent requests or questions.  Due to long hold times on the telephone, sending your provider a message by [facility] may be a faster and more efficient way to get a response.  Please allow 48 business hours for a response.  Please remember that this is for non-urgent requests.  _______________________________________________________    Thank you for entrusting me with your care and for choosing [facility],  Dr. Eulah Pont

## 2023-07-10 ENCOUNTER — Other Ambulatory Visit (HOSPITAL_COMMUNITY): Payer: Self-pay

## 2023-07-10 DIAGNOSIS — F3341 Major depressive disorder, recurrent, in partial remission: Secondary | ICD-10-CM | POA: Diagnosis not present

## 2023-07-10 DIAGNOSIS — F419 Anxiety disorder, unspecified: Secondary | ICD-10-CM | POA: Diagnosis not present

## 2023-07-18 ENCOUNTER — Other Ambulatory Visit: Payer: Self-pay

## 2023-07-18 ENCOUNTER — Other Ambulatory Visit (HOSPITAL_COMMUNITY): Payer: Self-pay

## 2023-07-24 DIAGNOSIS — F419 Anxiety disorder, unspecified: Secondary | ICD-10-CM | POA: Diagnosis not present

## 2023-07-24 DIAGNOSIS — F3341 Major depressive disorder, recurrent, in partial remission: Secondary | ICD-10-CM | POA: Diagnosis not present

## 2023-08-21 DIAGNOSIS — F419 Anxiety disorder, unspecified: Secondary | ICD-10-CM | POA: Diagnosis not present

## 2023-08-21 DIAGNOSIS — F3341 Major depressive disorder, recurrent, in partial remission: Secondary | ICD-10-CM | POA: Diagnosis not present

## 2023-09-03 ENCOUNTER — Encounter: Payer: Self-pay | Admitting: Family Medicine

## 2023-09-03 ENCOUNTER — Ambulatory Visit (INDEPENDENT_AMBULATORY_CARE_PROVIDER_SITE_OTHER): Payer: BC Managed Care – PPO | Admitting: Family Medicine

## 2023-09-03 VITALS — BP 112/62 | HR 98 | Ht 63.0 in | Wt 156.4 lb

## 2023-09-03 DIAGNOSIS — Z1159 Encounter for screening for other viral diseases: Secondary | ICD-10-CM | POA: Diagnosis not present

## 2023-09-03 DIAGNOSIS — E663 Overweight: Secondary | ICD-10-CM

## 2023-09-03 DIAGNOSIS — Z0000 Encounter for general adult medical examination without abnormal findings: Secondary | ICD-10-CM | POA: Diagnosis not present

## 2023-09-03 DIAGNOSIS — Z114 Encounter for screening for human immunodeficiency virus [HIV]: Secondary | ICD-10-CM

## 2023-09-03 LAB — BASIC METABOLIC PANEL
BUN: 15 mg/dL (ref 6–23)
CO2: 30 meq/L (ref 19–32)
Calcium: 9.7 mg/dL (ref 8.4–10.5)
Chloride: 102 meq/L (ref 96–112)
Creatinine, Ser: 0.79 mg/dL (ref 0.40–1.20)
GFR: 102.79 mL/min (ref >60.00)
Glucose, Bld: 66 mg/dL — ABNORMAL LOW (ref 70–99)
Potassium: 4.1 meq/L (ref 3.5–5.1)
Sodium: 140 meq/L (ref 135–145)

## 2023-09-03 LAB — CBC WITH DIFFERENTIAL/PLATELET
Basophils Absolute: 0 10*3/uL (ref 0.0–0.1)
Basophils Relative: 0.2 % (ref 0.0–3.0)
Eosinophils Absolute: 0.1 10*3/uL (ref 0.0–0.7)
Eosinophils Relative: 1.5 % (ref 0.0–5.0)
HCT: 41.1 % (ref 36.0–46.0)
Hemoglobin: 13.7 g/dL (ref 12.0–15.0)
Lymphocytes Relative: 28.7 % (ref 12.0–46.0)
Lymphs Abs: 2 10*3/uL (ref 0.7–4.0)
MCHC: 33.4 g/dL (ref 30.0–36.0)
MCV: 89.2 fl (ref 78.0–100.0)
Monocytes Absolute: 0.7 10*3/uL (ref 0.1–1.0)
Monocytes Relative: 9.6 % (ref 3.0–12.0)
Neutro Abs: 4.1 10*3/uL (ref 1.4–7.7)
Neutrophils Relative %: 60 % (ref 43.0–77.0)
Platelets: 277 10*3/uL (ref 150.0–400.0)
RBC: 4.61 Mil/uL (ref 3.87–5.11)
RDW: 12.3 % (ref 11.5–15.5)
WBC: 6.9 10*3/uL (ref 4.0–10.5)

## 2023-09-03 LAB — HEPATIC FUNCTION PANEL
ALT: 15 U/L (ref 0–35)
AST: 16 U/L (ref 0–37)
Albumin: 4.7 g/dL (ref 3.5–5.2)
Alkaline Phosphatase: 37 U/L — ABNORMAL LOW (ref 39–117)
Bilirubin, Direct: 0.1 mg/dL (ref 0.0–0.3)
Total Bilirubin: 0.6 mg/dL (ref 0.2–1.2)
Total Protein: 7.1 g/dL (ref 6.0–8.3)

## 2023-09-03 LAB — LIPID PANEL
Cholesterol: 170 mg/dL (ref 0–200)
HDL: 52.5 mg/dL (ref >39.00)
LDL Cholesterol: 94 mg/dL (ref 0–99)
NonHDL: 117.92
Total CHOL/HDL Ratio: 3
Triglycerides: 122 mg/dL (ref 0.0–149.0)
VLDL: 24.4 mg/dL (ref 0.0–40.0)

## 2023-09-03 LAB — TSH: TSH: 2.09 u[IU]/mL (ref 0.35–5.50)

## 2023-09-03 NOTE — Patient Instructions (Signed)
Follow up in 1 year or as needed We'll notify you of your lab results and make any changes if needed Keep up the good work on healthy diet and regular exercise- you look great!!! Call with any questions or concerns Stay Safe!  Stay Healthy! Happy Bel[employer] Birthday!!! 

## 2023-09-03 NOTE — Progress Notes (Signed)
   Subjective:    Patient ID: Jennifer Haley, female    DOB: 19-Apr-1996, 28 y.o.   MRN: [id number]  HPI CPE- UTD on pap, colonoscopy, Tdap  Patient Care Team    Relationship Specialty Notifications Start End  Sheliah Hatch, MD PCP - General Family Medicine  01/06/20   Imogene Burn, MD Consulting Physician Gastroenterology  09/03/23     Health Maintenance  Topic Date Due   HIV Screening  Never done   Hepatitis C Screening  Never done   COVID-19 Vaccine (4 - 2024-25 season) 02/16/2023   Cervical Cancer Screening (Pap smear)  02/01/2025   Colonoscopy  03/26/2028   DTaP/Tdap/Td (9 - Td or Tdap) 01/11/2029   INFLUENZA VACCINE  Completed   HPV VACCINES  Completed      Review of Systems Patient reports no vision/ hearing changes, adenopathy,fever, weight change,  persistant/recurrent hoarseness , swallowing issues, chest pain, palpitations, edema, persistant/recurrent cough, hemoptysis, dyspnea (rest/exertional/paroxysmal nocturnal), gastrointestinal bleeding (melena, rectal bleeding), significant heartburn, bowel changes, GU symptoms (dysuria, hematuria, incontinence), Gyn symptoms (abnormal  bleeding, pain),  syncope, focal weakness, memory loss, numbness & tingling, skin/hair/nail changes, abnormal bruising or bleeding, anxiety, or depression.   + episodic abdominal pain- follows w/ GI    Objective:   Physical Exam General Appearance:    Alert, cooperative, no distress, appears stated age  Head:    Normocephalic, without obvious abnormality, atraumatic  Eyes:    PERRL, conjunctiva/corneas clear, EOM's intact both eyes  Ears:    Normal TM's and external ear canals, both ears  Nose:   Nares normal, septum midline, mucosa normal, no drainage    or sinus tenderness  Throat:   Lips, mucosa, and tongue normal; teeth and gums normal  Neck:   Supple, symmetrical, trachea midline, no adenopathy;    Thyroid: no enlargement/tenderness/nodules  Back:     Symmetric, no curvature, ROM  normal, no CVA tenderness  Lungs:     Clear to auscultation bilaterally, respirations unlabored  Chest Wall:    No tenderness or deformity   Heart:    Regular rate and rhythm, S1 and S2 normal, no murmur, rub   or gallop  Breast Exam:    Deferred to GYN  Abdomen:     Soft, non-tender, bowel sounds active all four quadrants,    no masses, no organomegaly  Genitalia:    Deferred to GYN  Rectal:    Extremities:   Extremities normal, atraumatic, no cyanosis or edema  Pulses:   2+ and symmetric all extremities  Skin:   Skin color, texture, turgor normal, no rashes or lesions  Lymph nodes:   Cervical, supraclavicular, and axillary nodes normal  Neurologic:   CNII-XII intact, normal strength, sensation and reflexes    throughout          Assessment & Plan:

## 2023-09-03 NOTE — Assessment & Plan Note (Signed)
Pt's PE WNL.  UTD on pap, Tdap.  Check labs.  Anticipatory guidance provided.  

## 2023-09-04 ENCOUNTER — Encounter: Payer: Self-pay | Admitting: Family Medicine

## 2023-09-04 LAB — HEPATITIS C ANTIBODY: Hepatitis C Ab: NONREACTIVE

## 2023-09-04 LAB — HIV ANTIBODY (ROUTINE TESTING W REFLEX): HIV 1&2 Ab, 4th Generation: NONREACTIVE

## 2023-09-05 DIAGNOSIS — F3341 Major depressive disorder, recurrent, in partial remission: Secondary | ICD-10-CM | POA: Diagnosis not present

## 2023-09-05 DIAGNOSIS — F419 Anxiety disorder, unspecified: Secondary | ICD-10-CM | POA: Diagnosis not present

## 2023-09-19 DIAGNOSIS — F3341 Major depressive disorder, recurrent, in partial remission: Secondary | ICD-10-CM | POA: Diagnosis not present

## 2023-09-19 DIAGNOSIS — F419 Anxiety disorder, unspecified: Secondary | ICD-10-CM | POA: Diagnosis not present

## 2023-09-26 ENCOUNTER — Other Ambulatory Visit: Payer: Self-pay | Admitting: Family Medicine

## 2023-09-26 ENCOUNTER — Other Ambulatory Visit (HOSPITAL_COMMUNITY): Payer: Self-pay

## 2023-09-26 MED ORDER — ESCITALOPRAM OXALATE 20 MG PO TABS
20.0000 mg | ORAL_TABLET | Freq: Every day | ORAL | 3 refills | Status: DC
  Filled 2023-09-26: qty 30, 30d supply, fill #0
  Filled 2023-10-09: qty 30, 30d supply, fill #1
  Filled 2023-11-03 – 2023-11-08 (×3): qty 30, 30d supply, fill #2
  Filled 2023-12-01: qty 30, 30d supply, fill #3

## 2023-10-02 DIAGNOSIS — F3341 Major depressive disorder, recurrent, in partial remission: Secondary | ICD-10-CM | POA: Diagnosis not present

## 2023-10-02 DIAGNOSIS — F419 Anxiety disorder, unspecified: Secondary | ICD-10-CM | POA: Diagnosis not present

## 2023-10-09 ENCOUNTER — Other Ambulatory Visit (HOSPITAL_COMMUNITY): Payer: Self-pay

## 2023-10-09 ENCOUNTER — Other Ambulatory Visit: Payer: Self-pay | Admitting: Internal Medicine

## 2023-10-09 MED ORDER — DICYCLOMINE HCL 10 MG PO CAPS
10.0000 mg | ORAL_CAPSULE | Freq: Three times a day (TID) | ORAL | 0 refills | Status: AC | PRN
  Filled 2023-10-09: qty 90, 30d supply, fill #0

## 2023-10-16 DIAGNOSIS — F419 Anxiety disorder, unspecified: Secondary | ICD-10-CM | POA: Diagnosis not present

## 2023-10-16 DIAGNOSIS — F3341 Major depressive disorder, recurrent, in partial remission: Secondary | ICD-10-CM | POA: Diagnosis not present

## 2023-10-30 DIAGNOSIS — F419 Anxiety disorder, unspecified: Secondary | ICD-10-CM | POA: Diagnosis not present

## 2023-10-30 DIAGNOSIS — F3341 Major depressive disorder, recurrent, in partial remission: Secondary | ICD-10-CM | POA: Diagnosis not present

## 2023-11-03 ENCOUNTER — Other Ambulatory Visit (HOSPITAL_COMMUNITY): Payer: Self-pay

## 2023-11-06 DIAGNOSIS — F419 Anxiety disorder, unspecified: Secondary | ICD-10-CM | POA: Diagnosis not present

## 2023-11-06 DIAGNOSIS — F3341 Major depressive disorder, recurrent, in partial remission: Secondary | ICD-10-CM | POA: Diagnosis not present

## 2023-11-08 ENCOUNTER — Other Ambulatory Visit (HOSPITAL_COMMUNITY): Payer: Self-pay

## 2023-11-13 DIAGNOSIS — F419 Anxiety disorder, unspecified: Secondary | ICD-10-CM | POA: Diagnosis not present

## 2023-11-13 DIAGNOSIS — F3341 Major depressive disorder, recurrent, in partial remission: Secondary | ICD-10-CM | POA: Diagnosis not present

## 2023-11-27 DIAGNOSIS — F419 Anxiety disorder, unspecified: Secondary | ICD-10-CM | POA: Diagnosis not present

## 2023-11-27 DIAGNOSIS — F3341 Major depressive disorder, recurrent, in partial remission: Secondary | ICD-10-CM | POA: Diagnosis not present

## 2023-12-25 DIAGNOSIS — H5213 Myopia, bilateral: Secondary | ICD-10-CM | POA: Diagnosis not present

## 2023-12-25 DIAGNOSIS — H10413 Chronic giant papillary conjunctivitis, bilateral: Secondary | ICD-10-CM | POA: Diagnosis not present

## 2023-12-25 DIAGNOSIS — F419 Anxiety disorder, unspecified: Secondary | ICD-10-CM | POA: Diagnosis not present

## 2023-12-25 DIAGNOSIS — F3341 Major depressive disorder, recurrent, in partial remission: Secondary | ICD-10-CM | POA: Diagnosis not present

## 2023-12-31 ENCOUNTER — Other Ambulatory Visit: Payer: Self-pay

## 2023-12-31 ENCOUNTER — Other Ambulatory Visit (HOSPITAL_COMMUNITY): Payer: Self-pay

## 2023-12-31 ENCOUNTER — Other Ambulatory Visit: Payer: Self-pay | Admitting: Family Medicine

## 2023-12-31 ENCOUNTER — Encounter: Payer: Self-pay | Admitting: Pharmacist

## 2023-12-31 MED ORDER — ESCITALOPRAM OXALATE 20 MG PO TABS
20.0000 mg | ORAL_TABLET | Freq: Every day | ORAL | 3 refills | Status: DC
  Filled 2023-12-31 – 2024-01-05 (×2): qty 30, 30d supply, fill #0

## 2024-01-02 ENCOUNTER — Other Ambulatory Visit (HOSPITAL_COMMUNITY): Payer: Self-pay

## 2024-01-05 ENCOUNTER — Other Ambulatory Visit: Payer: Self-pay

## 2024-01-05 ENCOUNTER — Other Ambulatory Visit (HOSPITAL_COMMUNITY): Payer: Self-pay

## 2024-01-08 DIAGNOSIS — F3341 Major depressive disorder, recurrent, in partial remission: Secondary | ICD-10-CM | POA: Diagnosis not present

## 2024-01-08 DIAGNOSIS — F419 Anxiety disorder, unspecified: Secondary | ICD-10-CM | POA: Diagnosis not present

## 2024-01-14 ENCOUNTER — Other Ambulatory Visit: Payer: Self-pay | Admitting: Family

## 2024-01-14 ENCOUNTER — Encounter: Payer: Self-pay | Admitting: Family Medicine

## 2024-01-14 MED ORDER — ESCITALOPRAM OXALATE 20 MG PO TABS: 10.0000 mg | ORAL_TABLET | Freq: Every day | ORAL | Status: DC

## 2024-01-14 NOTE — Telephone Encounter (Signed)
 Patient is okay with lowering to that dose. FYI

## 2024-01-14 NOTE — Telephone Encounter (Signed)
 Please advise on MyChart message that patient sent in regards to dosing down on medication

## 2024-01-22 DIAGNOSIS — F3341 Major depressive disorder, recurrent, in partial remission: Secondary | ICD-10-CM | POA: Diagnosis not present

## 2024-02-05 DIAGNOSIS — F3341 Major depressive disorder, recurrent, in partial remission: Secondary | ICD-10-CM | POA: Diagnosis not present

## 2024-02-19 DIAGNOSIS — F3341 Major depressive disorder, recurrent, in partial remission: Secondary | ICD-10-CM | POA: Diagnosis not present

## 2024-02-21 ENCOUNTER — Other Ambulatory Visit (HOSPITAL_COMMUNITY): Payer: Self-pay

## 2024-02-21 ENCOUNTER — Other Ambulatory Visit: Payer: Self-pay | Admitting: Family Medicine

## 2024-02-23 ENCOUNTER — Other Ambulatory Visit (HOSPITAL_COMMUNITY): Payer: Self-pay

## 2024-02-23 MED ORDER — ESCITALOPRAM OXALATE 10 MG PO TABS
10.0000 mg | ORAL_TABLET | Freq: Every day | ORAL | 3 refills | Status: DC
  Filled 2024-02-23: qty 30, 30d supply, fill #0
  Filled 2024-03-20: qty 30, 30d supply, fill #1
  Filled 2024-04-21: qty 30, 30d supply, fill #2
  Filled 2024-05-23: qty 30, 30d supply, fill #3

## 2024-02-23 NOTE — Telephone Encounter (Signed)
 Patient is aware that 10mg  was sent to pharmacy. Patient told me she will not break tabs in half. FYI

## 2024-02-23 NOTE — Telephone Encounter (Signed)
 Prescription changed to 10mg  daily to reflect that this is what she is now taking.  She does not need to break the pill in half- she should take 1 tab daily

## 2024-02-23 NOTE — Telephone Encounter (Signed)
 Please call pt and see if she is weaning her Lexapro  and if so, what does she is currently taking so we can send in the correct medication

## 2024-02-23 NOTE — Telephone Encounter (Signed)
 Called patient and she is taking 10mg  of lexapro  every day. She will take that for a couple months she stated and then go down

## 2024-02-24 ENCOUNTER — Other Ambulatory Visit (HOSPITAL_COMMUNITY): Payer: Self-pay

## 2024-03-01 ENCOUNTER — Other Ambulatory Visit: Payer: Self-pay | Admitting: Internal Medicine

## 2024-03-01 ENCOUNTER — Other Ambulatory Visit (HOSPITAL_COMMUNITY): Payer: Self-pay

## 2024-03-01 MED ORDER — PANTOPRAZOLE SODIUM 40 MG PO TBEC
40.0000 mg | DELAYED_RELEASE_TABLET | Freq: Every day | ORAL | 1 refills | Status: DC
  Filled 2024-03-01: qty 30, 30d supply, fill #0
  Filled 2024-03-07 – 2024-04-06 (×3): qty 30, 30d supply, fill #1
  Filled 2024-04-29: qty 30, 30d supply, fill #2
  Filled 2024-05-31: qty 30, 30d supply, fill #3

## 2024-03-04 DIAGNOSIS — F3341 Major depressive disorder, recurrent, in partial remission: Secondary | ICD-10-CM | POA: Diagnosis not present

## 2024-03-07 ENCOUNTER — Other Ambulatory Visit (HOSPITAL_COMMUNITY): Payer: Self-pay

## 2024-03-19 DIAGNOSIS — F3341 Major depressive disorder, recurrent, in partial remission: Secondary | ICD-10-CM | POA: Diagnosis not present

## 2024-03-22 ENCOUNTER — Other Ambulatory Visit (HOSPITAL_COMMUNITY): Payer: Self-pay

## 2024-03-23 ENCOUNTER — Encounter: Payer: Self-pay | Admitting: Family Medicine

## 2024-04-06 ENCOUNTER — Ambulatory Visit: Admitting: Family Medicine

## 2024-04-06 ENCOUNTER — Other Ambulatory Visit: Payer: Self-pay

## 2024-04-06 ENCOUNTER — Other Ambulatory Visit (HOSPITAL_COMMUNITY): Payer: Self-pay

## 2024-04-08 DIAGNOSIS — F3341 Major depressive disorder, recurrent, in partial remission: Secondary | ICD-10-CM | POA: Diagnosis not present

## 2024-04-09 ENCOUNTER — Ambulatory Visit: Admitting: Family Medicine

## 2024-04-09 ENCOUNTER — Encounter: Payer: Self-pay | Admitting: Family Medicine

## 2024-04-09 ENCOUNTER — Other Ambulatory Visit (HOSPITAL_COMMUNITY): Payer: Self-pay

## 2024-04-09 VITALS — BP 118/64 | HR 71 | Temp 98.6°F | Resp 14 | Ht 63.0 in | Wt 159.8 lb

## 2024-04-09 DIAGNOSIS — F419 Anxiety disorder, unspecified: Secondary | ICD-10-CM

## 2024-04-09 MED ORDER — ALPRAZOLAM 0.5 MG PO TABS
0.5000 mg | ORAL_TABLET | Freq: Three times a day (TID) | ORAL | 1 refills | Status: AC | PRN
  Filled 2024-04-09: qty 30, 10d supply, fill #0

## 2024-04-09 NOTE — Patient Instructions (Signed)
 Follow up as needed or as scheduled CONTINUE the Lexapro  daily Use the Alprazolam as needed of anxiety Call with any questions or concerns Stay Safe!  Stay Healthy! Happy Fall!!

## 2024-04-09 NOTE — Progress Notes (Signed)
   Subjective:    Patient ID: Jennifer Haley, female    DOB: January 30, 1996, 28 y.o.   MRN: [id number]  HPI Anxiety- pt is currently in therapy and working through things and feels like she may have a panic disorder.  Has sxs when she has to fly for work- most frequently manifests as nausea.     Review of Systems For ROS see HPI     Objective:   Physical Exam Vitals reviewed.  Constitutional:      General: She is not in acute distress.    Appearance: Normal appearance. She is not ill-appearing.  HENT:     Head: Normocephalic and atraumatic.  Eyes:     Extraocular Movements: Extraocular movements intact.     Conjunctiva/sclera: Conjunctivae normal.  Skin:    General: Skin is warm and dry.  Neurological:     General: No focal deficit present.     Mental Status: She is alert and oriented to person, place, and time.  Psychiatric:        Mood and Affect: Mood normal.        Behavior: Behavior normal.        Thought Content: Thought content normal.           Assessment & Plan:

## 2024-04-09 NOTE — Assessment & Plan Note (Signed)
 Overall improved but continues to have episodic attacks during which she will be anxious, nauseous, but feel fine in between.  Will continue Lexapro  at current dose.  Add Alprazolam to use prn.  Pt expressed understanding and is in agreement w/ plan.

## 2024-04-16 DIAGNOSIS — F3341 Major depressive disorder, recurrent, in partial remission: Secondary | ICD-10-CM | POA: Diagnosis not present

## 2024-04-29 DIAGNOSIS — F3341 Major depressive disorder, recurrent, in partial remission: Secondary | ICD-10-CM | POA: Diagnosis not present

## 2024-04-30 ENCOUNTER — Other Ambulatory Visit (HOSPITAL_COMMUNITY): Payer: Self-pay

## 2024-05-10 ENCOUNTER — Other Ambulatory Visit (HOSPITAL_COMMUNITY): Payer: Self-pay

## 2024-05-10 ENCOUNTER — Encounter: Payer: Self-pay | Admitting: Family Medicine

## 2024-05-10 ENCOUNTER — Ambulatory Visit: Admitting: Family Medicine

## 2024-05-10 VITALS — BP 120/68 | HR 83 | Temp 98.0°F | Ht 63.0 in | Wt 155.1 lb

## 2024-05-10 DIAGNOSIS — R509 Fever, unspecified: Secondary | ICD-10-CM | POA: Diagnosis not present

## 2024-05-10 DIAGNOSIS — J101 Influenza due to other identified influenza virus with other respiratory manifestations: Secondary | ICD-10-CM

## 2024-05-10 LAB — POC INFLUENZA A&B (BINAX/QUICKVUE)
Influenza A, POC: POSITIVE — AB
Influenza B, POC: NEGATIVE

## 2024-05-10 LAB — POC COVID19 BINAXNOW: SARS Coronavirus 2 Ag: NEGATIVE

## 2024-05-10 MED ORDER — OSELTAMIVIR PHOSPHATE 75 MG PO CAPS
75.0000 mg | ORAL_CAPSULE | Freq: Two times a day (BID) | ORAL | 0 refills | Status: AC
  Filled 2024-05-10: qty 10, 5d supply, fill #0

## 2024-05-10 NOTE — Patient Instructions (Signed)
 Follow up as needed or as scheduled START the Tamiflu  twice daily Drink LOTS of fluids!!! REST! Tylenol or ibuprofen as needed for pain/headache Call with any questions or concerns Hang in there!!!

## 2024-05-10 NOTE — Progress Notes (Signed)
   Subjective:    Patient ID: Jennifer Haley, female    DOB: 07-Jan-1996, 28 y.o.   MRN: [id number]  HPI Fever- was traveling last week for work and Saturday got home feeling very tired.  Yesterday started feeling bad, developed fever.  Denies body aches, HA.  + mild cough.  + nasal congestion.   Review of Systems For ROS see HPI     Objective:   Physical Exam Vitals reviewed.  Constitutional:      General: She is not in acute distress.    Appearance: Normal appearance. She is well-developed.     Comments: Obviously not feeling well  HENT:     Head: Normocephalic and atraumatic.     Right Ear: Tympanic membrane normal.     Left Ear: Tympanic membrane normal.     Nose: Mucosal edema and congestion present. No rhinorrhea.     Right Sinus: No maxillary sinus tenderness or frontal sinus tenderness.     Left Sinus: No maxillary sinus tenderness or frontal sinus tenderness.     Mouth/Throat:     Pharynx: Uvula midline. No oropharyngeal exudate or posterior oropharyngeal erythema.  Eyes:     Conjunctiva/sclera: Conjunctivae normal.     Pupils: Pupils are equal, round, and reactive to light.  Cardiovascular:     Rate and Rhythm: Normal rate and regular rhythm.     Heart sounds: Normal heart sounds.  Pulmonary:     Effort: Pulmonary effort is normal. No respiratory distress.     Breath sounds: Normal breath sounds. No wheezing.  Musculoskeletal:     Cervical back: Normal range of motion and neck supple.  Lymphadenopathy:     Cervical: No cervical adenopathy.  Skin:    General: Skin is warm.  Neurological:     Mental Status: She is alert.           Assessment & Plan:  Influenza A- new.  Pt's sxs are consistent w/ dx and rapid test confirms.  Start Tamiflu  as fever started yesterday.  Reviewed supportive care and red flags that should prompt return.  Pt expressed understanding and is in agreement w/ plan.

## 2024-05-11 DIAGNOSIS — F3341 Major depressive disorder, recurrent, in partial remission: Secondary | ICD-10-CM | POA: Diagnosis not present

## 2024-05-27 DIAGNOSIS — F3341 Major depressive disorder, recurrent, in partial remission: Secondary | ICD-10-CM | POA: Diagnosis not present

## 2024-06-20 ENCOUNTER — Other Ambulatory Visit: Payer: Self-pay | Admitting: Family Medicine

## 2024-06-21 ENCOUNTER — Other Ambulatory Visit (HOSPITAL_COMMUNITY): Payer: Self-pay

## 2024-06-21 MED ORDER — ESCITALOPRAM OXALATE 10 MG PO TABS
10.0000 mg | ORAL_TABLET | Freq: Every day | ORAL | 3 refills | Status: AC
  Filled 2024-06-21: qty 30, 30d supply, fill #0
  Filled 2024-07-20: qty 30, 30d supply, fill #1

## 2024-06-28 ENCOUNTER — Other Ambulatory Visit: Payer: Self-pay | Admitting: Internal Medicine

## 2024-06-28 ENCOUNTER — Other Ambulatory Visit (HOSPITAL_COMMUNITY): Payer: Self-pay

## 2024-06-28 MED ORDER — PANTOPRAZOLE SODIUM 40 MG PO TBEC
40.0000 mg | DELAYED_RELEASE_TABLET | Freq: Every day | ORAL | 1 refills | Status: AC
  Filled 2024-06-28: qty 30, 30d supply, fill #0

## 2024-09-02 ENCOUNTER — Encounter: Admitting: Family Medicine
